# Patient Record
Sex: Female | Born: 1950
Health system: Southern US, Community
[De-identification: ages and names within clinical notes are randomized; demographics above are authoritative.]

## PROBLEM LIST (undated history)

## (undated) DIAGNOSIS — F0390 Unspecified dementia without behavioral disturbance: Secondary | ICD-10-CM

## (undated) DIAGNOSIS — R569 Unspecified convulsions: Secondary | ICD-10-CM

## (undated) HISTORY — DX: Unspecified dementia, unspecified severity, without behavioral disturbance, psychotic disturbance, mood disturbance, and anxiety: F03.90

---

## 1998-03-01 ENCOUNTER — Other Ambulatory Visit: Admission: RE | Admit: 1998-03-01 | Discharge: 1998-03-01 | Payer: Self-pay | Admitting: Obstetrics and Gynecology

## 1998-05-13 ENCOUNTER — Other Ambulatory Visit: Admission: RE | Admit: 1998-05-13 | Discharge: 1998-05-13 | Payer: Self-pay | Admitting: Obstetrics and Gynecology

## 1998-05-28 ENCOUNTER — Ambulatory Visit (HOSPITAL_COMMUNITY): Admission: RE | Admit: 1998-05-28 | Discharge: 1998-05-28 | Payer: Self-pay | Admitting: *Deleted

## 1999-05-11 ENCOUNTER — Other Ambulatory Visit: Admission: RE | Admit: 1999-05-11 | Discharge: 1999-05-11 | Payer: Self-pay | Admitting: Obstetrics and Gynecology

## 1999-08-09 ENCOUNTER — Other Ambulatory Visit: Admission: RE | Admit: 1999-08-09 | Discharge: 1999-08-09 | Payer: Self-pay | Admitting: Obstetrics and Gynecology

## 2000-01-10 ENCOUNTER — Encounter (INDEPENDENT_AMBULATORY_CARE_PROVIDER_SITE_OTHER): Payer: Self-pay | Admitting: Specialist

## 2000-01-10 ENCOUNTER — Encounter (INDEPENDENT_AMBULATORY_CARE_PROVIDER_SITE_OTHER): Payer: Self-pay

## 2000-01-10 ENCOUNTER — Ambulatory Visit (HOSPITAL_COMMUNITY): Admission: RE | Admit: 2000-01-10 | Discharge: 2000-01-10 | Payer: Self-pay | Admitting: Obstetrics and Gynecology

## 2001-09-27 ENCOUNTER — Other Ambulatory Visit: Admission: RE | Admit: 2001-09-27 | Discharge: 2001-09-27 | Payer: Self-pay | Admitting: Obstetrics and Gynecology

## 2002-10-07 ENCOUNTER — Other Ambulatory Visit: Admission: RE | Admit: 2002-10-07 | Discharge: 2002-10-07 | Payer: Self-pay | Admitting: Obstetrics and Gynecology

## 2003-10-20 ENCOUNTER — Other Ambulatory Visit: Admission: RE | Admit: 2003-10-20 | Discharge: 2003-10-20 | Payer: Self-pay | Admitting: Obstetrics and Gynecology

## 2004-10-21 ENCOUNTER — Other Ambulatory Visit: Admission: RE | Admit: 2004-10-21 | Discharge: 2004-10-21 | Payer: Self-pay | Admitting: Obstetrics and Gynecology

## 2007-07-31 ENCOUNTER — Ambulatory Visit (HOSPITAL_COMMUNITY): Admission: RE | Admit: 2007-07-31 | Discharge: 2007-07-31 | Payer: Self-pay | Admitting: *Deleted

## 2009-11-20 ENCOUNTER — Emergency Department (HOSPITAL_COMMUNITY): Admission: EM | Admit: 2009-11-20 | Discharge: 2009-11-20 | Payer: Self-pay | Admitting: Emergency Medicine

## 2010-05-29 ENCOUNTER — Encounter: Payer: Self-pay | Admitting: Obstetrics and Gynecology

## 2010-09-20 NOTE — Op Note (Signed)
NAMENIAMH, RADA              ACCOUNT NO.:  [id number]   MEDICAL RECORD NO.:  [id number]          PATIENT TYPE:  AMB   LOCATION:  ENDO                         FACILITY:  [facility]   PHYSICIAN:  Georgiana Spinner, M.D.    DATE OF BIRTH:  1950/07/24   DATE OF PROCEDURE:  07/31/2007  DATE OF DISCHARGE:                               OPERATIVE REPORT   PROCEDURE:  Colonoscopy.   ENDOSCOPIST:  Georgiana Spinner, M.D.   INDICATIONS:  Dilated colon seen on ultrasound   ANESTHESIA:  Fentanyl 100 mcg, Versed 10 mg.   PROCEDURE:  With the patient mildly sedated in the left lateral  decubitus position, the Pentax videoscopic pediatric colonoscope was  inserted into the rectum and passed under direct vision with pressure  applied to reach the cecum through a tortuous colon.  Cecum was  identified by ileocecal valve.  There was not well seen appendiceal  orifice, but clearly the base of the cecum was well seen.  Photographs  were taken.  From this point, the colonoscope was slowly withdrawn,  taking circumferential views of colonic mucosa, stopping only in the  rectum, which appeared normal on direct and showed small hemorrhoids on  retroflexed view.  The endoscope was straightened and withdrawn.  The  patient's vital signs and pulse oximetry remained stable.  The patient  tolerated the procedure well without apparent complications.   FINDINGS:  Tortuous colon which may have led to a gas-filled loop of  colon, but otherwise an unremarkable examination, other than small  internal hemorrhoids.   PLAN:  Consider repeat examination in 5-10 years.           ______________________________  Georgiana Spinner, M.D.     GMO/MEDQ  D:  07/31/2007  T:  07/31/2007  Job:  [id number]   cc:   Guy Sandifer. Henderson Cloud, M.D.  Fax: [phone]

## 2010-09-23 NOTE — Op Note (Signed)
[facility]  Patient:    Michele Porter, Michele Porter                     MRN: [id number] Proc. Date: 01/10/00 Adm. Date:  [id number] Attending:  Cordelia Pen Ii                           Operative Report  PREOPERATIVE DIAGNOSIS:       Right ovarian cyst.  POSTOPERATIVE DIAGNOSIS:      Right ovarian cyst.  OPERATION/PROCEDURE:          1. Laparoscopy.                               2. Right salpingo-oophorectomy.  SURGEON:                      Guy Sandifer. Arleta Creek, M.D.  ANESTHESIA:                   General, with endotracheal intubation.  ANESTHESIOLOGIST:             Octaviano Glow. Pamalee Leyden, M.D.  ESTIMATED BLOOD LOSS:         Drops.  INDICATIONS FOR PROCEDURE:    The patient is a 60 year old married white female, G0 P0, with a right ovarian cyst.  Details are dictated in the History and Physical.  Laparoscopy with probable right salpingo-oophorectomy was discussed.  The possible risks and complications have been discussed including, but not limited to, infection, bowel or bladder or ureteral damage, bleeding requiring transfusion of blood products with possible transfusion reaction, HIV, or hepatitis acquisition, DVT, PE, pneumonia, and laparotomy. All questions were answered and consent is signed and on the chart.  FINDINGS:                     Upper abdomen is normal.  The uterus is distorted with an approximate 3 cm subserosal right anterior fundal fibroid, as well as an approximate 2 cm subserosal posterior midline fundal fibroid. The left ovary and tube appear entirely normal.  The right ovary contains an approximate 2 cm cyst.  The right fallopian tube is normal.  Anterior and posterior cul-de-sac normal.  DESCRIPTION OF PROCEDURE:     The patient was taken to the operating room and placed in the dorsal supine position, and general anesthesia induced via endotracheal intubation.  She was then placed in the dorsal lithotomy position and prepped  abdominally and vaginally.  The bladder was straight catheterized. A Hulka tenaculum was placed on the uterus as a manipulator and she was draped in sterile fashion.  A small infraumbilical incision was made and a 12 mm disposable trocar sheath was placed on the first attempt without difficulty. Placement was verified with the laparoscope and no damage to surrounding structures was noted.  Pneumoperitoneum was induced and a small suprapubic and later a left lower quadrant incision made after careful transillumination, and 5 mm nondisposable trocar sleeves placed under direct visualization without difficulty.  The above findings were noted.  The course of the right ureter was carefully identified and found to be well clear of the right ovary.  Then using the 5 mm laparoscope through the left lower quadrant trocar sleeve the disposable stapler with the wide cartridge was used to take down the infundibulopelvic ligament and then the proximal ligaments in  two consecutive bites.  Switching back to the operative scope careful inspection revealed excellent hemostasis and good placement of the staples.  Then converting back again to the 5 mm laparoscope the Endo cage was used to remove the ovary through the umbilical incision without difficulty.  The umbilical trocar sleeve was then replaced bluntly without the trocar in place without difficulty, and switching back to the operative laparoscope careful inspection again revealed good excellent hemostasis.  The pelvis was washed and excess fluid was removed.  It should also be noted that pelvic washings are sent for cytology at the beginning of the case.  The inferior trocar sleeves were removed and the peritoneum was reduced, and no bleeding was noted.  The umbilical trocar sleeve was removed and the umbilical incision closed first with a 0 Vicryl suture in the deep underlying tissues, with care being taken not to pick up any underlying structures.   The skin incisions were all closed with subcuticular 3-0 Vicryl.  The incisions were injected with Marcaine 0.50% and dressings applied.  The Hulka tenaculum was removed and no bleeding was noted.  All counts were correct.  The patient was awakened and taken to the recovery room in stable condition. DD:  01/10/00 TD:  01/10/00 Job: [id number]

## 2011-07-14 ENCOUNTER — Other Ambulatory Visit (HOSPITAL_COMMUNITY): Payer: Self-pay | Admitting: Obstetrics and Gynecology

## 2011-07-14 DIAGNOSIS — R1032 Left lower quadrant pain: Secondary | ICD-10-CM

## 2011-07-19 ENCOUNTER — Ambulatory Visit (HOSPITAL_COMMUNITY)
Admission: RE | Admit: 2011-07-19 | Discharge: 2011-07-19 | Disposition: A | Discharge status: Home / Self Care | Payer: 59 | Source: Ambulatory Visit | Attending: Obstetrics and Gynecology | Admitting: Obstetrics and Gynecology

## 2011-07-19 DIAGNOSIS — D259 Leiomyoma of uterus, unspecified: Secondary | ICD-10-CM | POA: Insufficient documentation

## 2011-07-19 DIAGNOSIS — R1032 Left lower quadrant pain: Secondary | ICD-10-CM | POA: Insufficient documentation

## 2011-07-19 MED ORDER — IOHEXOL 300 MG/ML  SOLN
100.0000 mL | Freq: Once | INTRAMUSCULAR | Status: AC | PRN
  Administered 2011-07-19: 100 mL via INTRAVENOUS

## 2014-04-07 DIAGNOSIS — Z0000 Encounter for general adult medical examination without abnormal findings: Secondary | ICD-10-CM | POA: Diagnosis not present

## 2014-04-07 DIAGNOSIS — M810 Age-related osteoporosis without current pathological fracture: Secondary | ICD-10-CM | POA: Diagnosis not present

## 2014-04-14 DIAGNOSIS — I440 Atrioventricular block, first degree: Secondary | ICD-10-CM | POA: Diagnosis not present

## 2014-04-14 DIAGNOSIS — Z0000 Encounter for general adult medical examination without abnormal findings: Secondary | ICD-10-CM | POA: Diagnosis not present

## 2014-04-14 DIAGNOSIS — G43909 Migraine, unspecified, not intractable, without status migrainosus: Secondary | ICD-10-CM | POA: Diagnosis not present

## 2014-04-14 DIAGNOSIS — M810 Age-related osteoporosis without current pathological fracture: Secondary | ICD-10-CM | POA: Diagnosis not present

## 2014-05-15 DIAGNOSIS — J069 Acute upper respiratory infection, unspecified: Secondary | ICD-10-CM | POA: Diagnosis not present

## 2014-06-01 ENCOUNTER — Other Ambulatory Visit: Payer: Self-pay | Admitting: Obstetrics and Gynecology

## 2014-06-01 DIAGNOSIS — Z1231 Encounter for screening mammogram for malignant neoplasm of breast: Secondary | ICD-10-CM | POA: Diagnosis not present

## 2014-06-01 DIAGNOSIS — Z124 Encounter for screening for malignant neoplasm of cervix: Secondary | ICD-10-CM | POA: Diagnosis not present

## 2014-06-02 LAB — CYTOLOGY - PAP

## 2015-02-09 DIAGNOSIS — H1033 Unspecified acute conjunctivitis, bilateral: Secondary | ICD-10-CM | POA: Diagnosis not present

## 2015-02-16 DIAGNOSIS — H43811 Vitreous degeneration, right eye: Secondary | ICD-10-CM | POA: Diagnosis not present

## 2015-03-02 DIAGNOSIS — Z23 Encounter for immunization: Secondary | ICD-10-CM | POA: Diagnosis not present

## 2015-04-08 ENCOUNTER — Other Ambulatory Visit: Payer: Self-pay | Admitting: Internal Medicine

## 2015-04-08 DIAGNOSIS — R413 Other amnesia: Secondary | ICD-10-CM | POA: Diagnosis not present

## 2015-04-08 DIAGNOSIS — R4701 Aphasia: Secondary | ICD-10-CM | POA: Diagnosis not present

## 2015-04-08 DIAGNOSIS — G4489 Other headache syndrome: Secondary | ICD-10-CM

## 2015-04-08 DIAGNOSIS — R51 Headache: Secondary | ICD-10-CM | POA: Diagnosis not present

## 2015-04-09 ENCOUNTER — Other Ambulatory Visit: Payer: Self-pay | Admitting: Internal Medicine

## 2015-04-09 DIAGNOSIS — R51 Headache: Principal | ICD-10-CM

## 2015-04-09 DIAGNOSIS — R413 Other amnesia: Secondary | ICD-10-CM

## 2015-04-09 DIAGNOSIS — R519 Headache, unspecified: Secondary | ICD-10-CM

## 2015-04-09 DIAGNOSIS — R4701 Aphasia: Secondary | ICD-10-CM

## 2015-04-14 DIAGNOSIS — N390 Urinary tract infection, site not specified: Secondary | ICD-10-CM | POA: Diagnosis not present

## 2015-04-14 DIAGNOSIS — Z23 Encounter for immunization: Secondary | ICD-10-CM | POA: Diagnosis not present

## 2015-04-14 DIAGNOSIS — R3129 Other microscopic hematuria: Secondary | ICD-10-CM | POA: Diagnosis not present

## 2015-04-14 DIAGNOSIS — R319 Hematuria, unspecified: Secondary | ICD-10-CM | POA: Diagnosis not present

## 2015-04-14 DIAGNOSIS — M810 Age-related osteoporosis without current pathological fracture: Secondary | ICD-10-CM | POA: Diagnosis not present

## 2015-04-14 DIAGNOSIS — R413 Other amnesia: Secondary | ICD-10-CM | POA: Diagnosis not present

## 2015-04-14 DIAGNOSIS — L03114 Cellulitis of left upper limb: Secondary | ICD-10-CM | POA: Diagnosis not present

## 2015-04-14 DIAGNOSIS — Z0000 Encounter for general adult medical examination without abnormal findings: Secondary | ICD-10-CM | POA: Diagnosis not present

## 2015-04-14 DIAGNOSIS — I440 Atrioventricular block, first degree: Secondary | ICD-10-CM | POA: Diagnosis not present

## 2015-04-14 DIAGNOSIS — E559 Vitamin D deficiency, unspecified: Secondary | ICD-10-CM | POA: Diagnosis not present

## 2015-04-14 DIAGNOSIS — Z13220 Encounter for screening for lipoid disorders: Secondary | ICD-10-CM | POA: Diagnosis not present

## 2015-04-15 ENCOUNTER — Ambulatory Visit
Admission: RE | Admit: 2015-04-15 | Discharge: 2015-04-15 | Disposition: A | Discharge status: Home / Self Care | Payer: Medicare Other | Source: Ambulatory Visit | Attending: Internal Medicine | Admitting: Internal Medicine

## 2015-04-15 DIAGNOSIS — R51 Headache: Principal | ICD-10-CM

## 2015-04-15 DIAGNOSIS — R519 Headache, unspecified: Secondary | ICD-10-CM

## 2015-04-15 DIAGNOSIS — R4701 Aphasia: Secondary | ICD-10-CM | POA: Diagnosis not present

## 2015-04-15 DIAGNOSIS — R413 Other amnesia: Secondary | ICD-10-CM

## 2015-04-16 ENCOUNTER — Ambulatory Visit
Admission: RE | Admit: 2015-04-16 | Discharge: 2015-04-16 | Disposition: A | Discharge status: Home / Self Care | Payer: Medicare Other | Source: Ambulatory Visit | Attending: Internal Medicine | Admitting: Internal Medicine

## 2015-04-16 ENCOUNTER — Other Ambulatory Visit: Payer: Medicare Other

## 2015-04-16 DIAGNOSIS — G4489 Other headache syndrome: Secondary | ICD-10-CM

## 2015-04-16 DIAGNOSIS — R51 Headache: Secondary | ICD-10-CM | POA: Diagnosis not present

## 2015-04-16 MED ORDER — GADOBENATE DIMEGLUMINE 529 MG/ML IV SOLN
13.0000 mL | Freq: Once | INTRAVENOUS | Status: AC | PRN
  Administered 2015-04-16: 13 mL via INTRAVENOUS

## 2015-04-21 DIAGNOSIS — I440 Atrioventricular block, first degree: Secondary | ICD-10-CM | POA: Diagnosis not present

## 2015-04-21 DIAGNOSIS — R3129 Other microscopic hematuria: Secondary | ICD-10-CM | POA: Diagnosis not present

## 2015-04-21 DIAGNOSIS — R413 Other amnesia: Secondary | ICD-10-CM | POA: Diagnosis not present

## 2015-04-21 DIAGNOSIS — L03114 Cellulitis of left upper limb: Secondary | ICD-10-CM | POA: Diagnosis not present

## 2015-05-12 ENCOUNTER — Encounter: Payer: Self-pay | Admitting: Neurology

## 2015-05-12 ENCOUNTER — Ambulatory Visit (INDEPENDENT_AMBULATORY_CARE_PROVIDER_SITE_OTHER): Payer: Medicare Other | Admitting: Neurology

## 2015-05-12 VITALS — BP 122/64 | HR 72 | Ht 60.0 in | Wt 138.0 lb

## 2015-05-12 DIAGNOSIS — R519 Headache, unspecified: Secondary | ICD-10-CM

## 2015-05-12 DIAGNOSIS — R51 Headache: Secondary | ICD-10-CM

## 2015-05-12 DIAGNOSIS — R4189 Other symptoms and signs involving cognitive functions and awareness: Secondary | ICD-10-CM

## 2015-05-12 NOTE — Progress Notes (Signed)
NEUROLOGY CONSULTATION NOTE  FAE STETZ MRN: [id number] DOB: December 19, 1950  Referring provider: Dr. Shelia Media Primary care provider: Dr. Shelia Media  Reason for consult:  Cognitive impairment, headache  HISTORY OF PRESENT ILLNESS: Michele Porter is a 65 year old right-handed female with no significant past medical history who presents for headache, memory loss and speech difficulty.  History obtained by patient, her husband and PCP note.  Labs and imaging of brain MRI reviewed.  History primarily taken from husband, as patient is poor historian.  Her husband began noticing problems with speech and language about a year ago, which has progressed.  At first, she would mispronounce words or phrases.  She then had difficulty carrying on a conversation.  She has word-finding trouble as well as difficulty pronouncing words.  She is able to read without difficulty but if she should recite the words out loud, she has difficulty.  Comprehension seems intact.  She has had trouble with numbers as well.  She had trouble correctly punching the buttons on the microwave.  When she writes a check, she is able to write in the numbers but struggles spelling out the numbers.  She is able to perform everyday tasks, such as dressing and cooking.  She drives locally around town, such as to the grocery store without difficulty.  Behavior and personality are normal.  She denies depression although she is anxious about what is going on.  To her husband, both short-term memory and long-term memory seem overall intact.  Over the past 3 months, she has had reported headache.  It is more of an ache or sensation of "pings" along the maxillary sinuses.  It occurs once a week and responds to acetaminophen.  She has remote history of migraines, but these are different.  MRI of brain with and without contrast from 04/16/15 showed mild cerebral atrophy but no mass lesions or acute intracranial process.  RPR nonreactive, B12 1056, TSH  1.020, WBC and CMP unremarkable.  She graduated from [place] with a degree in Personnel officer.  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  Her mother was diagnosed with dementia late in life and passed away in her 74s.  Her maternal aunt and cousin also had dementia.  PAST MEDICAL HISTORY: No past medical history on file.  PAST SURGICAL HISTORY: No past surgical history on file.  MEDICATIONS: No current outpatient prescriptions on file prior to visit.   No current facility-administered medications on file prior to visit.    ALLERGIES: No Known Allergies  FAMILY HISTORY: No family history on file.  SOCIAL HISTORY: Social History   Social History  . Marital Status: Married    Spouse Name: N/A  . Number of Children: N/A  . Years of Education: N/A   Occupational History  . Not on file.   Social History Main Topics  . Smoking status: Never Smoker   . Smokeless tobacco: Not on file  . Alcohol Use: Not on file  . Drug Use: Not on file  . Sexual Activity: Not on file   Other Topics Concern  . Not on file   Social History Narrative   Pt has BS degree.     REVIEW OF SYSTEMS: Constitutional: No fevers, chills, or sweats, no generalized fatigue, change in appetite Eyes: No visual changes, double vision, eye pain Ear, nose and throat: No hearing loss, ear pain, nasal congestion, sore throat Cardiovascular: No chest pain, palpitations Respiratory:  No shortness of  breath at rest or with exertion, wheezes GastrointestinaI: No nausea, vomiting, diarrhea, abdominal pain, fecal incontinence Genitourinary:  No dysuria, urinary retention or frequency Musculoskeletal:  No neck pain, back pain Integumentary: No rash, pruritus, skin lesions Neurological: as above Psychiatric: No depression, insomnia, anxiety Endocrine: No palpitations, fatigue, diaphoresis, mood swings, change in appetite, change in weight, increased thirst Hematologic/Lymphatic:   No anemia, purpura, petechiae. Allergic/Immunologic: no itchy/runny eyes, nasal congestion, recent allergic reactions, rashes  PHYSICAL EXAM: Filed Vitals:   05/12/15 0838  BP: 122/64  Pulse: 72   General: No acute distress.  Patient appears well-groomed.  Head:  Normocephalic/atraumatic Eyes:  fundi unremarkable, without vessel changes, exudates, hemorrhages or papilledema. Neck: supple, no paraspinal tenderness, full range of motion Back: No paraspinal tenderness Heart: regular rate and rhythm Lungs: Clear to auscultation bilaterally. Vascular: No carotid bruits. Neurological Exam: Mental status: alert and oriented to person, place, month, day and date (but not year), delayed recall poor, remote memory intact, fund of knowledge intact, attention and concentration poor, visuospatial and executive dysfunction, speech fluent and not dysarthric.  She was able to name.  Naming fluency was poor.  She had trouble following some simple commands. Montreal Cognitive Assessment  05/12/2015  Visuospatial/ Executive (0/5) 0  Naming (0/3) 3  Attention: Read list of digits (0/2) 0  Attention: Read list of letters (0/1) 0  Attention: Serial 7 subtraction starting at 100 (0/3) 0  Language: Repeat phrase (0/2) 0  Language : Fluency (0/1) 0  Abstraction (0/2) 1  Delayed Recall (0/5) 0  Orientation (0/6) 4  Total 8  Adjusted Score (based on education) 8   Cranial nerves: CN I: not tested CN II: pupils equal, round and reactive to light, visual fields intact, fundi unremarkable, without vessel changes, exudates, hemorrhages or papilledema. CN III, IV, VI:  full range of motion, no nystagmus, no ptosis CN V: facial sensation intact CN VII: upper and lower face symmetric CN VIII: hearing intact CN IX, X: gag intact, uvula midline CN XI: sternocleidomastoid and trapezius muscles intact CN XII: tongue midline Bulk & Tone: normal, no fasciculations. Motor:  5/5 throughout  Sensation: temperature  and vibration sensation intact. Deep Tendon Reflexes:  2+ throughout, toes downgoing.  Finger to nose testing:  Without dysmetria.  Heel to shin:  Without dysmetria.  Gait:  Normal station and stride.  Able to turn and tandem walk. Romberg negative.  IMPRESSION: Patient demonstrates deficiencies in multiple domains but clinically she has primary deficits in language and perhaps some aspects of short-term memory.  Consider Alzheimer's or logopenic progressive aphasia. Headache.  Not true headache, but rather facial pain.  No evidence of sinusitis on MRI.   PLAN: Will refer for formal neuropsychological testing.  I will see her afterwards for further management. Headaches are overall manageable, so she should continue treating with acetaminophen (limited to no more than 2 days out of the week to prevent rebound headache) Advised not to drive at this time, given her visuospatial dysfunction and poor performance on Trail Making Test.  Thank you for allowing me to take part in the care of this patient.  Metta Clines, DO  CC:  Deland Pretty, MD

## 2015-05-12 NOTE — Progress Notes (Signed)
Chart forwarded.  

## 2015-05-12 NOTE — Patient Instructions (Signed)
We will refer you for neuropsychologic evaluation.  I want you to follow up afterwards. In meantime, please do not drive.

## 2015-05-19 ENCOUNTER — Telehealth: Payer: Self-pay | Admitting: Neurology

## 2015-05-19 DIAGNOSIS — R4189 Other symptoms and signs involving cognitive functions and awareness: Secondary | ICD-10-CM

## 2015-05-19 NOTE — Telephone Encounter (Signed)
PT's spouse Jeneen Rinks called and said they have not received a call from [facility] yet/Dawn CB# [phone]

## 2015-05-19 NOTE — Telephone Encounter (Signed)
Have confirmation referral was received via fax on 05/12/15. Refaxed just incase. Left message on pt's vm with [facility]'s contact info.

## 2015-05-21 ENCOUNTER — Telehealth: Payer: Self-pay | Admitting: Neurology

## 2015-05-21 NOTE — Telephone Encounter (Signed)
VM-PT's husband [name] left a message saying they still have not heard from [facility] yet and he said it is nine days later from when she was seen here by Dr Tomi Likens and  Still has not heard anything,  and is not happy about the lack of follow up with his wife/Dawn CB# [phone]

## 2015-05-21 NOTE — Telephone Encounter (Signed)
Spoke with husband. Upset that he has not heard from [facility] was left the first time husband called, but he stated he was reluctant to call because he didn't feel it was their responsibility. Encouraged pt to call if he was concerned about the wait.

## 2015-05-24 NOTE — Telephone Encounter (Signed)
Received fax from [facility] is 07/12/15 @11  am. Testing Session is 07/12/15 @ 1 p m. Feedback/Resutls scheduled for 07/28/15@ 2 pm.

## 2015-07-09 DIAGNOSIS — Z6822 Body mass index (BMI) 22.0-22.9, adult: Secondary | ICD-10-CM | POA: Diagnosis not present

## 2015-07-09 DIAGNOSIS — Z1231 Encounter for screening mammogram for malignant neoplasm of breast: Secondary | ICD-10-CM | POA: Diagnosis not present

## 2015-07-09 DIAGNOSIS — Z01419 Encounter for gynecological examination (general) (routine) without abnormal findings: Secondary | ICD-10-CM | POA: Diagnosis not present

## 2015-07-12 DIAGNOSIS — R41841 Cognitive communication deficit: Secondary | ICD-10-CM | POA: Diagnosis not present

## 2015-07-12 DIAGNOSIS — R450 Nervousness: Secondary | ICD-10-CM | POA: Diagnosis not present

## 2015-07-12 DIAGNOSIS — R413 Other amnesia: Secondary | ICD-10-CM | POA: Diagnosis not present

## 2015-07-28 DIAGNOSIS — G3101 Pick's disease: Secondary | ICD-10-CM | POA: Diagnosis not present

## 2015-07-28 DIAGNOSIS — R4702 Dysphasia: Secondary | ICD-10-CM | POA: Diagnosis not present

## 2015-07-28 DIAGNOSIS — F4322 Adjustment disorder with anxiety: Secondary | ICD-10-CM | POA: Diagnosis not present

## 2015-07-28 DIAGNOSIS — G309 Alzheimer's disease, unspecified: Secondary | ICD-10-CM | POA: Diagnosis not present

## 2015-07-29 ENCOUNTER — Telehealth: Payer: Self-pay

## 2015-07-29 NOTE — Telephone Encounter (Signed)
Husband called. Would like to know if you will prescribe the medication that were recommended by [facility] now, or not until seen? Next available appointment is 11/16/15. Is that soon enough? Please advise.

## 2015-07-30 NOTE — Telephone Encounter (Signed)
Pt scheduled for 08/09/15 @ 1 p.m.

## 2015-07-30 NOTE — Telephone Encounter (Signed)
I would like them to follow up next available so we can sit face to face to discuss the next steps.

## 2015-08-09 ENCOUNTER — Encounter: Payer: Self-pay | Admitting: Neurology

## 2015-08-09 ENCOUNTER — Ambulatory Visit (INDEPENDENT_AMBULATORY_CARE_PROVIDER_SITE_OTHER): Payer: Medicare Other | Admitting: Neurology

## 2015-08-09 VITALS — BP 130/64 | HR 66 | Ht 64.0 in | Wt 135.0 lb

## 2015-08-09 DIAGNOSIS — G3101 Pick's disease: Secondary | ICD-10-CM

## 2015-08-09 DIAGNOSIS — F0280 Dementia in other diseases classified elsewhere without behavioral disturbance: Secondary | ICD-10-CM

## 2015-08-09 MED ORDER — DONEPEZIL HCL 5 MG PO TABS: 5.0000 mg | ORAL_TABLET | Freq: Every day | ORAL | Status: DC

## 2015-08-09 NOTE — Progress Notes (Signed)
NEUROLOGY FOLLOW UP OFFICE NOTE  Michele Porter [id number]  HISTORY OF PRESENT ILLNESS: Michele Porter is a 65 year old right-handed female with no significant past medical history who follows up with her husband for logenic primary progressive aphasia to discuss neuropsychological testing results.  UPDATE: Michele Porter underwent neuropsychological testing on 07/12/15.  She demonstrated deficits of expressive language, repetition, confrontation naming, verbal fluency and comprehension, consistent with logopenic variant primary progressive aphasia.  She exhibited frequent phonemic paraphasias, circumlocution and slightly improved fluency of speech during casual conversation versus response to specific or complex questions.  She also met criteria for adjustment disorder with anxiety.  HISTORY: Her husband began noticing problems with speech and language about a year ago, which has progressed.  At first, she would mispronounce words or phrases.  She then had difficulty carrying on a conversation.  She has word-finding trouble as well as difficulty pronouncing words.  She is able to read without difficulty but if she should recite the words out loud, she has difficulty.  Comprehension seems intact.  She has had trouble with numbers as well.  She had trouble correctly punching the buttons on the microwave.  When she writes a check, she is able to write in the numbers but struggles spelling out the numbers.  She is able to perform everyday tasks, such as dressing and cooking.  She drives locally around town, such as to the grocery store without difficulty.  Behavior and personality are normal.  She denies depression although she is anxious about what is going on.  To her husband, both short-term memory and long-term memory seem overall intact.  Over the past 3 months, she has had reported headache.  It is more of an ache or sensation of "pings" along the maxillary sinuses.  It occurs once a week and  responds to acetaminophen.  She has remote history of migraines, but these are different.  MRI of brain with and without contrast from 04/16/15 showed mild cerebral atrophy but no mass lesions or acute intracranial process.  RPR nonreactive, B12 1056, TSH 1.020, WBC and CMP unremarkable.  She graduated from [place] with a degree in Personnel officer.  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  Her mother was diagnosed with dementia late in life and passed away in her 40s.  Her maternal aunt and cousin also had dementia.  PAST MEDICAL HISTORY: No past medical history on file.  MEDICATIONS: No current outpatient prescriptions on file prior to visit.   No current facility-administered medications on file prior to visit.    ALLERGIES: Allergies  Allergen Reactions  . Sulfa Antibiotics     FAMILY HISTORY: Family History  Problem Relation Age of Onset  . Dementia Mother     SOCIAL HISTORY: Social History   Social History  . Marital Status: Married    Spouse Name: N/A  . Number of Children: N/A  . Years of Education: N/A   Occupational History  . Not on file.   Social History Main Topics  . Smoking status: Never Smoker   . Smokeless tobacco: Not on file  . Alcohol Use: Not on file  . Drug Use: Not on file  . Sexual Activity: Not on file   Other Topics Concern  . Not on file   Social History Narrative   Pt has BS degree.     REVIEW OF SYSTEMS: Constitutional: No fevers, chills, or sweats, no generalized fatigue, change in appetite  Eyes: No visual changes, double vision, eye pain Ear, nose and throat: No hearing loss, ear pain, nasal congestion, sore throat Cardiovascular: No chest pain, palpitations Respiratory:  No shortness of breath at rest or with exertion, wheezes GastrointestinaI: No nausea, vomiting, diarrhea, abdominal pain, fecal incontinence Genitourinary:  No dysuria, urinary retention or frequency Musculoskeletal:  No  neck pain, back pain Integumentary: No rash, pruritus, skin lesions Neurological: as above Psychiatric: No depression, insomnia, anxiety Endocrine: No palpitations, fatigue, diaphoresis, mood swings, change in appetite, change in weight, increased thirst Hematologic/Lymphatic:  No anemia, purpura, petechiae. Allergic/Immunologic: no itchy/runny eyes, nasal congestion, recent allergic reactions, rashes  PHYSICAL EXAM: Filed Vitals:   08/09/15 1248  BP: 130/64  Pulse: 66   General: No acute distress.  Patient appears well-groomed.   Head:  Normocephalic/atraumatic  IMPRESSION: Logopenic variant progressive aphasia  PLAN: 1. Will initiate Aricept 2m at bedtime.  Will increase to 117mat bedtime in 4 weeks if tolerating.  After another 2 to 4 weeks, will initiate Namenda 2.  No driving until formal driving evaluation with OT. 3.  Continue routine exercise, hobbies, socialization 4.  Mediterranean diet 5.  Speech therapy 6.  Follow up in July  26 minutes spent face to face with patient, 100% spent discussing diagnosis and management.  Michele Porter  CC:  WaDeland Pretty

## 2015-08-09 NOTE — Patient Instructions (Signed)
1.  We will start donepezil (Aricept) 5mg  daily for four weeks.  If you are tolerating the medication, then after four weeks, we will increase the dose to 10mg  daily.  Side effects include nausea, vomiting, diarrhea, vivid dreams, and muscle cramps.  Please call the clinic if you experience any of these symptoms. 2.  At this point, I do not think you should drive until you have a formal driving evaluation.   3.  Continue routine exercise 4.  Recommend Mediterranean diet    Why follow it? Research shows. . Those who follow the Mediterranean diet have a reduced risk of heart disease  . The diet is associated with a reduced incidence of Parkinson's and Alzheimer's diseases . People following the diet may have longer life expectancies and lower rates of chronic diseases  . The Dietary Guidelines for Americans recommends the Mediterranean diet as an eating plan to promote health and prevent disease  What Is the Mediterranean Diet?  . Healthy eating plan based on typical foods and recipes of Mediterranean-style cooking . The diet is primarily a plant based diet; these foods should make up a majority of meals   Starches - Plant based foods should make up a majority of meals - They are an important sources of vitamins, minerals, energy, antioxidants, and fiber - Choose whole grains, foods high in fiber and minimally processed items  - Typical grain sources include wheat, oats, barley, corn, brown rice, bulgar, farro, millet, polenta, couscous  - Various types of beans include chickpeas, lentils, fava beans, black beans, white beans   Fruits  Veggies - Large quantities of antioxidant rich fruits & veggies; 6 or more servings  - Vegetables can be eaten raw or lightly drizzled with oil and cooked  - Vegetables common to the traditional Mediterranean Diet include: artichokes, arugula, beets, broccoli, brussel sprouts, cabbage, carrots, celery, collard greens, cucumbers, eggplant, kale, leeks, lemons,  lettuce, mushrooms, okra, onions, peas, peppers, potatoes, pumpkin, radishes, rutabaga, shallots, spinach, sweet potatoes, turnips, zucchini - Fruits common to the Mediterranean Diet include: apples, apricots, avocados, cherries, clementines, dates, figs, grapefruits, grapes, melons, nectarines, oranges, peaches, pears, pomegranates, strawberries, tangerines  Fats - Replace butter and margarine with healthy oils, such as olive oil, canola oil, and tahini  - Limit nuts to no more than a handful a day  - Nuts include walnuts, almonds, pecans, pistachios, pine nuts  - Limit or avoid candied, honey roasted or heavily salted nuts - Olives are central to the [employer] - can be eaten whole or used in a variety of dishes   Meats Protein - Limiting red meat: no more than a few times a month - When eating red meat: choose lean cuts and keep the portion to the size of deck of cards - Eggs: approx. 0 to 4 times a week  - Fish and lean poultry: at least 2 a week  - Healthy protein sources include, chicken, [place], lean beef, lamb - Increase intake of seafood such as tuna, salmon, trout, mackerel, shrimp, scallops - Avoid or limit high fat processed meats such as sausage and bacon  Dairy - Include moderate amounts of low fat dairy products  - Focus on healthy dairy such as fat free yogurt, skim milk, low or reduced fat cheese - Limit dairy products higher in fat such as whole or 2% milk, cheese, ice cream  Alcohol - Moderate amounts of red wine is ok  - No more than 5 oz daily for women (all ages)  and men older than age 67  - No more than 10 oz of wine daily for men younger than 61  Other - Limit sweets and other desserts  - Use herbs and spices instead of salt to flavor foods  - Herbs and spices common to the traditional Mediterranean Diet include: basil, bay leaves, chives, cloves, cumin, fennel, garlic, lavender, marjoram, mint, oregano, parsley, pepper, rosemary, sage, savory, sumac, tarragon,  thyme   It's not just a diet, it's a lifestyle:  . The Mediterranean diet includes lifestyle factors typical of those in the region  . Foods, drinks and meals are best eaten with others and savored . Daily physical activity is important for overall good health . This could be strenuous exercise like running and aerobics . This could also be more leisurely activities such as walking, housework, yard-work, or taking the stairs . Moderation is the key; a balanced and healthy diet accommodates most foods and drinks . Consider portion sizes and frequency of consumption of certain foods   Meal Ideas & Options:  . Breakfast:  o Whole wheat toast or whole wheat English muffins with peanut butter & hard boiled egg o Steel cut oats topped with apples & cinnamon and skim milk  o Fresh fruit: banana, strawberries, melon, berries, peaches  o Smoothies: strawberries, bananas, greek yogurt, peanut butter o Low fat greek yogurt with blueberries and granola  o Egg white omelet with spinach and mushrooms o Breakfast couscous: whole wheat couscous, apricots, skim milk, cranberries  . Sandwiches:  o Hummus and grilled vegetables (peppers, zucchini, squash) on whole wheat bread   o Grilled chicken on whole wheat pita with lettuce, tomatoes, cucumbers or tzatziki  o Tuna salad on whole wheat bread: tuna salad made with greek yogurt, olives, red peppers, capers, green onions o Garlic rosemary lamb pita: lamb sauted with garlic, rosemary, salt & pepper; add lettuce, cucumber, greek yogurt to pita - flavor with lemon juice and black pepper  . Seafood:  o Mediterranean grilled salmon, seasoned with garlic, basil, parsley, lemon juice and black pepper o Shrimp, lemon, and spinach whole-grain pasta salad made with low fat greek yogurt  o Seared scallops with lemon orzo  o Seared tuna steaks seasoned salt, pepper, coriander topped with tomato mixture of olives, tomatoes, olive oil, minced garlic, parsley, green  onions and cappers  . Meats:  o Herbed greek chicken salad with kalamata olives, cucumber, feta  o Red bell peppers stuffed with spinach, bulgur, lean ground beef (or lentils) & topped with feta   o Kebabs: skewers of chicken, tomatoes, onions, zucchini, squash  o [place] burgers: made with red onions, mint, dill, lemon juice, feta cheese topped with roasted red peppers . Vegetarian o Cucumber salad: cucumbers, artichoke hearts, celery, red onion, feta cheese, tossed in olive oil & lemon juice  o Hummus and whole grain pita points with a greek salad (lettuce, tomato, feta, olives, cucumbers, red onion) o Lentil soup with celery, carrots made with vegetable broth, garlic, salt and pepper  o Tabouli salad: parsley, bulgur, mint, scallions, cucumbers, tomato, radishes, lemon juice, olive oil, salt and pepper. 5.  Remain active (hobbies, socialize with friends and family) 64.  Follow up in July, as scheduled.

## 2015-08-09 NOTE — Progress Notes (Signed)
Chart forwarded.  

## 2015-08-23 ENCOUNTER — Telehealth: Payer: Self-pay | Admitting: Neurology

## 2015-08-23 DIAGNOSIS — R4701 Aphasia: Secondary | ICD-10-CM

## 2015-08-23 NOTE — Telephone Encounter (Signed)
Okay to referral to ST? Please advise.

## 2015-08-23 NOTE — Telephone Encounter (Signed)
Morgin Daigre ( husband) to Milaun Beams 2050-10-01. H e was calling this morning wanting to see about finding a speech therapist for his wife. She was last seen on 08/09/15. His contact number is [phone]. Thank  you

## 2015-08-23 NOTE — Telephone Encounter (Signed)
Yes

## 2015-08-23 NOTE — Telephone Encounter (Signed)
Referral placed to [facility]. [facility]) [phone]

## 2015-08-30 ENCOUNTER — Telehealth: Payer: Self-pay | Admitting: Neurology

## 2015-08-30 MED ORDER — DONEPEZIL HCL 10 MG PO TABS: 10.0000 mg | ORAL_TABLET | Freq: Every day | ORAL | Status: DC

## 2015-08-30 NOTE — Telephone Encounter (Signed)
New RX sent in. Husband aware.

## 2015-08-30 NOTE — Telephone Encounter (Signed)
Pt husband [name], states that we needs to call in [facility] in to the [facility] [address]. He states that we started her out on 5 mg and we need to more her to 10mg   Pt phone number is [phone]

## 2015-09-02 ENCOUNTER — Ambulatory Visit: Payer: Medicare Other | Attending: Neurology | Admitting: Speech Pathology

## 2015-09-02 DIAGNOSIS — R4701 Aphasia: Secondary | ICD-10-CM | POA: Diagnosis not present

## 2015-09-02 NOTE — Therapy (Signed)
[facility] [address] [place], [name], [address] Phone: [phone]   Fax:  [phone]  Speech Language Pathology Evaluation  Patient Details  Name: Michele Porter MRN: [id number] Date of Birth: 12/01/1950 Referring Provider: Dr. Metta Clines  Encounter Date: 09/02/2015      End of Session - 09/02/15 1702    Visit Number 1   Number of Visits 17   Date for SLP Re-Evaluation 11/23/15   Authorization Type Pt going on 2 week vacation middle of May - plan to start therapy upon their return, week of 10/04/15, therefore I put re-eval date out due to delayed start of therapy   SLP Start Time 1402   SLP Stop Time  1455   SLP Time Calculation (min) 53 min      No past medical history on file.  No past surgical history on file.  There were no vitals filed for this visit.      Subjective Assessment - 09/02/15 1410    Subjective "I know what I want to say, sometimes I can and sometimes I can't"   Patient is accompained by: Family member            SLP Evaluation [facility] - 09/02/15 1411    SLP Visit Information   SLP Received On 09/02/15   Referring Provider Dr. Metta Clines   Onset Date 05/2015   Medical Diagnosis Primary progressive aphasia   Subjective   Patient/Family Stated Goal "To help me communicate"   General Information   HPI Michele Porter is a 65 year old right-handed female with no significant past medical history with recent dx of logenic primary progressive aphasia. Michele Porter underwent neuropsychological testing on 07/12/15. She demonstrated deficits of expressive language, repetition, confrontation naming, verbal fluency and comprehension, consistent with logopenic variant primary progressive aphasia. She exhibited frequent phonemic paraphasias, circumlocution and slightly improved fluency of speech during casual conversation versus response to specific or complex questions. She also met criteria for  adjustment disorder with anxiety.   Mobility Status walks independently, no falls   Prior Functional Status   Cognitive/Linguistic Baseline Baseline deficits   Baseline deficit details progressive aphasia symptoms since last summer   Type of Home House    Lives With Spouse   Available Support Friend(s)   Vocation Retired   Associate Professor   Overall Cognitive Status --   Auditory Comprehension   Overall Auditory Comprehension Impaired   Yes/No Questions Impaired   Basic Immediate Environment Questions 75-100% accurate   Complex Questions 50-74% accurate   Commands Impaired   Two Step Basic Commands 75-100% accurate   Multistep Basic Commands 50-74% accurate   Complex Commands 50-74% accurate   Conversation Simple   EffectiveTechniques Extra processing time;Repetition;Slowed speech;Visual/Gestural cues   Reading Comprehension   Reading Status Impaired   Word level 76-100% accurate   Sentence Level 51-75% accurate   Paragraph Level Not tested   Effective Techniques Visual cueing   Expression   Primary Mode of Expression Verbal   Verbal Expression   Overall Verbal Expression Impaired   Initiation No impairment   Automatic Speech Name;Counting;Day of week;Month of year  Mod A for months    Level of Generative/Spontaneous Verbalization Conversation   Repetition Impaired   Level of Impairment Phrase level   Naming Impairment   Responsive 51-75% accurate   Confrontation 75-100% accurate   Convergent Not tested   Divergent 25-49% accurate   Verbal Errors Aware of errors   Pragmatics No  impairment   Written Expression   Dominant Hand Right   Written Expression Exceptions to [facility]   Dictation Ability Phrase   Self Formulation Ability Phrase   Oral Motor/Sensory Function   Overall Oral Motor/Sensory Function Appears within functional limits for tasks assessed   Motor Speech   Overall Motor Speech Appears within functional limits for tasks assessed                       ADULT SLP TREATMENT - 09/02/15 1730    Cognitive-Linquistic Treatment   Skilled Treatment Self care/home management: lengthy discussion with pt and spouse re: language activities to be done regularly at.. Initiated training in appropriate cueing for pt during homework provided and initiated training of compensations for aphasia with the pt.           SLP Education - 09/02/15 1701    Education provided Yes   Education Details areas of language impairment (reading, auditory, written, verbal expression), goals of therapy, compensations for aphasia   Person(s) Educated Patient;Spouse   Methods Explanation;Demonstration;Verbal cues   Comprehension Verbalized understanding;Returned demonstration;Need further instruction          SLP Short Term Goals - 09/02/15 1722    SLP SHORT TERM GOAL #1   Title Pt. will utilize multi-modal communication with 85% success during structured speech/language tasks with occasional min A   Time 4   Period Weeks   Status New   SLP SHORT TERM GOAL #2   Title Pt will complete simple naming activities with 85% accuracy and occasional min A   Time 4   Period Weeks   Status New   SLP SHORT TERM GOAL #3   Title Pt will comprehend functional information (signs, menus, directions) and 3-4 word sentences with 80% accuracy and occasional min A   Time 4   Period Weeks   Status New          SLP Long Term Goals - 09/02/15 1725    SLP LONG TERM GOAL #1   Title Pt will utilize multi modal communication (descriptives, word associations, gestures, writing,  drawing etc) to effectively participate in 8 minute simple conversation with occasional min A.   Time 8   Period Weeks   Status New   SLP LONG TERM GOAL #2   Title Spouse/caregiver will demonstrate adequate cueing to assist word finding and written expression during language tasks/homework  with rare min A   Time 8   Period Weeks   Status New   SLP LONG TERM GOAL #3   Title Pt will report carryover of  structure language activities at home over 4 sessions.   Time 8   Period Weeks   Status New          Plan - 09/02/15 1706    Clinical Impression Statement Michele Porter, a 65 y.o, female is referred for ST due to recent dx of primary progressive aphasia. Today she presents with moderate non fluent aphasia. Auditory comprehension is intact to simple 2 step commands, but 3 step commands, multi step directions and conversation benefit from reduced rate, use of high frequency words and repetition. Automatic speech is intact for counting and days of the week, but Mrs. Dumm required mod A for months of the year. Repetition is intact to 1-2 words, with more difficulty with longer phrases. Confrontation naming of basic objects  80% accurate with extended time. Responsive naming requried min to mod A, possibly due to reduced auditory comprehension.  Mrs. Schumm named 2 items for a simple category (10 in 1 min is [facility]). Simple conversation lacks cohesion due to vague generalizations during word finding episodes. Mrs. Headings' did attempt to compensate for dysnomia with 50% success. She is usually aware of errors. Reading comprehension is intact to 1-2 word level, with extended time. Written expression is intact to her name - she demonstrated aphasic errors writing her address and simple phrases to dictation. Mrs. Langworthy was aware of hre written errors, but required mod A to correct them. She exhibited anxiety during this evaluation, frequently wringing her hands. Despite moderate aphasia, Mrs. Rey is pleasant and quite talkative. She organizes her neighborhood's social actiivities and enjoys visiting her next door neighbor regularly. Her husband appears very supportive and willing to help Cyndi with therapy work. I recommend skilled ST to maximize effective communication and maintain language skills for independence.    Speech Therapy Frequency 2x / week   Duration --  8 weeks   Treatment/Interventions  Internal/external aids;Compensatory strategies;SLP instruction and feedback;Language facilitation;Cognitive reorganization;Patient/family education;Functional tasks;Multimodal communcation approach;Cueing hierarchy   Potential to Achieve Goals Fair   Potential Considerations Medical prognosis   Consulted and Agree with Plan of Care Patient   Family Member Consulted spouse [name]      Patient will benefit from skilled therapeutic intervention in order to improve the following deficits and impairments:   Aphasia - Plan: SLP plan of care cert/re-cert      G-Codes - 97/33/12 1729    Functional Assessment Tool Used NOMS   Functional Limitations Spoken language expressive   Spoken Language Expression Current Status [phone]) At least 40 percent but less than 60 percent impaired, limited or restricted   Spoken Language Expression Goal Status ([id number]) At least 40 percent but less than 60 percent impaired, limited or restricted   Spoken Language Expression Discharge Status [phone]) At least 40 percent but less than 60 percent impaired, limited or restricted      Problem List There are no active problems to display for this patient.   Lovvorn, [name] MS, CCC-SLP 09/02/2015, 5:33 PM  [facility] [address] [place], [name], [address] Phone: [phone]   Fax:  [phone]  Name: ELIANNAH HINDE MRN: [id number] Date of Birth: 17-Oct-1950

## 2015-10-05 ENCOUNTER — Telehealth: Payer: Self-pay | Admitting: Neurology

## 2015-10-05 ENCOUNTER — Ambulatory Visit: Payer: Medicare Other | Attending: Neurology | Admitting: Speech Pathology

## 2015-10-05 DIAGNOSIS — R4701 Aphasia: Secondary | ICD-10-CM | POA: Insufficient documentation

## 2015-10-05 MED ORDER — DONEPEZIL HCL 10 MG PO TABS: 10.0000 mg | ORAL_TABLET | Freq: Every day | ORAL | Status: DC

## 2015-10-05 NOTE — Telephone Encounter (Signed)
Rx sent to pharmacy   

## 2015-10-05 NOTE — Patient Instructions (Signed)
Tips for Talking with People who have Aphasia  . Say one thing at a time . Don't  rush - slow down, be patient . Reduce background noise . Relax - be natural . Use pen and paper . Write down key words . Draw diagrams or pictures . Don't pretend you understand . Ask what helps . Recap - check you both understand   Describing words  What group does it belong to?  What do I use it for?  Where can I find it?  What does it LOOK like?  What other words go with it?  What is the 1st sound of the word?  What color is it?  Many Ways to Communicate  Describe it Write it Draw it Gesture it Use related words  There's an App for that: Family Feud, Heads up, Stop-fun categories, What if, Noe Gens    Provided by: Barnabas Lister [place], [phone]

## 2015-10-05 NOTE — Therapy (Signed)
[facility] [address] [place], [name], [address] Phone: [phone]   Fax:  [phone]  Speech Language Pathology Treatment  Patient Details  Name: JESSKA VANVRANKEN MRN: [id number] Date of Birth: Oct 03, 1950 Referring Provider: Dr. Metta Clines  Encounter Date: 10/05/2015      End of Session - 10/05/15 1101    Visit Number 2   Number of Visits 17   Date for SLP Re-Evaluation 11/23/15   Authorization Type Pt going on 2 week vacation middle of May - plan to start therapy upon their return, week of 10/04/15, therefore I put re-eval date out due to delayed start of therapy   SLP Start Time 0845   SLP Stop Time  0932   SLP Time Calculation (min) 47 min   Activity Tolerance Patient tolerated treatment well      No past medical history on file.  No past surgical history on file.  There were no vitals filed for this visit.      Subjective Assessment - 10/05/15 0849    Subjective "We had a nice time - the weather was wonderful"   Patient is accompained by: Family member               ADULT SLP TREATMENT - 10/05/15 0850    General Information   Behavior/Cognition Alert;Cooperative   HPI Primary progressive aphasia   Treatment Provided   Treatment provided Cognitive-Linquistic   Pain Assessment   Pain Assessment No/denies pain   Cognitive-Linquistic Treatment   Treatment focused on Aphasia   Skilled Treatment Reviewed pt's homework - pt required cues to ID errors. Spouse present, reports he helped her with some of it. Facilitated compensations for aphasia with generating 4 desciptions of simple object/animal. Pt required usual mod A to make descriptions general and relevant. Picture stimuli was used and pt described specifics of picture instead of general triats (ie: he has a brown nose wit a white stripe vs this is an animal we ride with a mane). As descriptive activity progressed., pt improved to  occasional mod A/questioning cues. Pt named 3-4 items for a simple category with usual mod A and mod instructions to use compensations during this naming task. Provided word association homework to help train compensations for aphasia.   Assessment / Recommendations / Plan   Plan Continue with current plan of care   Progression Toward Goals   Progression toward goals Progressing toward goals          SLP Education - 10/05/15 1059    Education provided Yes   Education Details compensations for aphasia   Person(s) Educated Patient;Spouse   Methods Explanation;Demonstration;Handout;Verbal cues   Comprehension Verbalized understanding;Need further instruction;Verbal cues required          SLP Short Term Goals - 10/05/15 1101    SLP SHORT TERM GOAL #1   Title Pt. will utilize multi-modal communication with 85% success during structured speech/language tasks with occasional min A   Time 4   Period Weeks   Status On-going   SLP SHORT TERM GOAL #2   Title Pt will complete simple naming activities with 85% accuracy and occasional min A   Time 4   Period Weeks   Status On-going   SLP SHORT TERM GOAL #3   Title Pt will comprehend functional information (signs, menus, directions) and 3-4 word sentences with 80% accuracy and occasional min A   Time 4   Period Weeks   Status On-going  SLP Long Term Goals - 10/05/15 1101    SLP LONG TERM GOAL #1   Title Pt will utilize multi modal communication (descriptives, word associations, gestures, writing,  drawing etc) to effectively participate in 8 minute simple conversation with occasional min A.   Time 8   Period Weeks   Status On-going   SLP LONG TERM GOAL #2   Title Spouse/caregiver will demonstrate adequate cueing to assist word finding and written expression during language tasks/homework  with rare min A   Time 8   Period Weeks   Status On-going   SLP LONG TERM GOAL #3   Title Pt will report carryover of structure  language activities at home over 4 sessions.   Time 8   Period Weeks   Status On-going          Plan - 10/05/15 1059    Clinical Impression Statement Pt required mod A to use compensations for aphasia during structured speech tasks and mod A for simple naming. Continue skilled ST to maintain current language and train pt for compensations to maximize communication as disease progresses.    Speech Therapy Frequency 2x / week   Duration --  8 weeks   Treatment/Interventions Internal/external aids;Compensatory strategies;SLP instruction and feedback;Language facilitation;Cognitive reorganization;Patient/family education;Functional tasks;Multimodal communcation approach;Cueing hierarchy   Potential to Achieve Goals Fair   Potential Considerations Medical prognosis   Consulted and Agree with Plan of Care Patient;Family member/caregiver   Family Member Consulted spouse [name]      Patient will benefit from skilled therapeutic intervention in order to improve the following deficits and impairments:   Aphasia    Problem List There are no active problems to display for this patient.   Larico Dimock, [name] MS, CCC-SLP 10/05/2015, 11:02 AM  [facility] [address] [place], [name], [address] Phone: [phone]   Fax:  [phone]   Name: KALEIA REINDL MRN: [id number] Date of Birth: 08-22-1950

## 2015-10-05 NOTE — Telephone Encounter (Signed)
Michele Porter Sep 18, 2050. Her husband Jeneen Rinks called needing to get a refill on her Donapazil 10 MG. She uses [facility] on [address]  Her number is [phone]. Thank you

## 2015-10-07 ENCOUNTER — Ambulatory Visit: Payer: Medicare Other | Attending: Neurology | Admitting: Speech Pathology

## 2015-10-07 DIAGNOSIS — R4701 Aphasia: Secondary | ICD-10-CM | POA: Diagnosis not present

## 2015-10-07 NOTE — Therapy (Signed)
[facility] [address] [place], [name], [address] Phone: [phone]   Fax:  [phone]  Speech Language Pathology Treatment  Patient Details  Name: Michele Porter MRN: [id number] Date of Birth: 1950-08-30 Referring Provider: Dr. Metta Clines  Encounter Date: 10/07/2015      End of Session - 10/07/15 1437    Visit Number 3   Number of Visits 17   Date for SLP Re-Evaluation 11/23/15   Authorization Type Pt going on 2 week vacation middle of May - plan to start therapy upon their return, week of 10/04/15, therefore I put re-eval date out due to delayed start of therapy   SLP Start Time 1316   SLP Stop Time  1410   SLP Time Calculation (min) 54 min      No past medical history on file.  No past surgical history on file.  There were no vitals filed for this visit.      Subjective Assessment - 10/07/15 1429    Subjective "This homework helps with focus"               ADULT SLP TREATMENT - 10/07/15 1430    General Information   Behavior/Cognition Alert;Cooperative   HPI Primary progressive aphasia   Treatment Provided   Treatment provided Cognitive-Linquistic   Pain Assessment   Pain Assessment No/denies pain   Cognitive-Linquistic Treatment   Treatment focused on Aphasia   Skilled Treatment Pt and spouse instructed to visit their local fire station and let them know that pt has progressive aphasia in case she ever needs to contact emergency services or 911. I provided an  aphasia ID card  from [facility] website - instructed pt and spouse to make copies and place them in her car and purse. Also suggested they add Jimmy's (spouse) phone number on card.  Initiated training in simple gestures for basic objects to compensate for aphsasia as aphasia progresses. Pt required consistent max A for simple gestures due to difficulty comprehending the task. Gestures for items such as spoon, comb,  book, tissue etc required ST consistently modeling most salient gestures and pt imitating them. List of words to gesture sent home for practice. Synonym/antonym homework provided.    Assessment / Recommendations / Plan   Plan Continue with current plan of care   Progression Toward Goals   Progression toward goals Progressing toward goals          SLP Education - 10/07/15 1434    Education provided Yes   Education Details gestural communication including facial expressions as needed, compensations for aphasia   Person(s) Educated Patient;Spouse   Methods Explanation;Demonstration;Handout;Verbal cues   Comprehension Verbal cues required;Need further instruction          SLP Short Term Goals - 10/07/15 1437    SLP SHORT TERM GOAL #1   Title Pt. will utilize multi-modal communication with 85% success during structured speech/language tasks with occasional min A   Time 3   Period Weeks   Status On-going   SLP SHORT TERM GOAL #2   Title Pt will complete simple naming activities with 85% accuracy and occasional min A   Time 3   Period Weeks   Status On-going   SLP SHORT TERM GOAL #3   Title Pt will comprehend functional information (signs, menus, directions) and 3-4 word sentences with 80% accuracy and occasional min A   Time 3   Period Weeks   Status On-going  SLP Long Term Goals - 10/07/15 1437    SLP LONG TERM GOAL #1   Title Pt will utilize multi modal communication (descriptives, word associations, gestures, writing,  drawing etc) to effectively participate in 8 minute simple conversation with occasional min A.   Time 7   Period Weeks   Status On-going   SLP LONG TERM GOAL #2   Title Spouse/caregiver will demonstrate adequate cueing to assist word finding and written expression during language tasks/homework  with rare min A   Time 7   Period Weeks   Status On-going   SLP LONG TERM GOAL #3   Title Pt will report carryover of structure language activities at  home over 4 sessions.   Time 7   Period Weeks   Status On-going          Plan - 10/07/15 1435    Clinical Impression Statement Pt required max A for simple gesture training to compensate for aphasia as disease progresses. Instructions for 911 access and aphasia ID card provided. Continue skilled ST to maximize current language and carryover of compensations for aphasia now and as disease progresses   Speech Therapy Frequency 2x / week   Treatment/Interventions Internal/external aids;Compensatory strategies;SLP instruction and feedback;Language facilitation;Cognitive reorganization;Patient/family education;Functional tasks;Multimodal communcation approach;Cueing hierarchy   Potential to Achieve Goals Fair   Potential Considerations Medical prognosis   Consulted and Agree with Plan of Care Patient;Family member/caregiver   Family Member Consulted spouse [name]      Patient will benefit from skilled therapeutic intervention in order to improve the following deficits and impairments:   Aphasia    Problem List There are no active problems to display for this patient.   Priseis Cratty, [name]  MS, CCC-SLP  10/07/2015, 2:38 PM  [facility] [address] [place], [name], [address] Phone: [phone]   Fax:  [phone]   Name: Michele Porter MRN: [id number] Date of Birth: 1950/07/06

## 2015-10-12 ENCOUNTER — Ambulatory Visit: Payer: Medicare Other | Admitting: Speech Pathology

## 2015-10-12 DIAGNOSIS — R4701 Aphasia: Secondary | ICD-10-CM

## 2015-10-12 NOTE — Therapy (Signed)
[facility] [address] [place], [name], [address] Phone: [phone]   Fax:  [phone]  Speech Language Pathology Treatment  Patient Details  Name: KASSADIE OPSAHL MRN: [id number] Date of Birth: Dec 11, 1950 Referring Provider: Dr. Metta Clines  Encounter Date: 10/12/2015      End of Session - 10/12/15 1133    Visit Number 4   Number of Visits 17   Date for SLP Re-Evaluation 11/23/15   Authorization Type Pt going on 2 week vacation middle of May - plan to start therapy upon their return, week of 10/04/15, therefore I put re-eval date out due to delayed start of therapy   SLP Start Time 0844   SLP Stop Time  0931   SLP Time Calculation (min) 47 min      No past medical history on file.  No past surgical history on file.  There were no vitals filed for this visit.      Subjective Assessment - 10/12/15 0914    Currently in Pain? No/denies               ADULT SLP TREATMENT - 10/12/15 0927    General Information   Behavior/Cognition Alert;Cooperative   HPI Primary progressive aphasia   Treatment Provided   Treatment provided Cognitive-Linquistic   Pain Assessment   Pain Assessment No/denies pain   Cognitive-Linquistic Treatment   Treatment focused on Aphasia   Skilled Treatment Facilated compensations for aphasia with pt generating multiple meaning definitions of simple words with usual mod semantic cues and cloze phrases - pt with difficulty comprending how to complete cloze phrases. Introduced pt to [facility] for ongoing practice at home and upon d/c. Facilitated reading comprehension, simple naming and linguistic sequencing using [facility] with occasional mod cues for reading and sequencing.  Pt's spouse  state he is going to call [facility].    Assessment / Recommendations / Plan   Plan Continue with current plan of care   Progression Toward Goals   Progression toward goals  Progressing toward goals          SLP Education - 10/12/15 1130    Education provided Yes   Education Details use of apps ([facility]) for ongoing language enhacing activities.   Person(s) Educated Patient;Spouse   Methods Explanation;Demonstration;Tactile cues;Verbal cues   Comprehension Returned demonstration;Verbal cues required;Need further instruction          SLP Short Term Goals - 10/12/15 1133    SLP SHORT TERM GOAL #1   Title Pt. will utilize multi-modal communication with 85% success during structured speech/language tasks with occasional min A   Time 2   Period Weeks   Status On-going   SLP SHORT TERM GOAL #2   Title Pt will complete simple naming activities with 85% accuracy and occasional min A   Time 2   Period Weeks   Status On-going   SLP SHORT TERM GOAL #3   Title Pt will comprehend functional information (signs, menus, directions) and 3-4 word sentences with 80% accuracy and occasional min A   Time 2   Period Weeks   Status On-going          SLP Long Term Goals - 10/12/15 1133    SLP LONG TERM GOAL #1   Title Pt will utilize multi modal communication (descriptives, word associations, gestures, writing,  drawing etc) to effectively participate in 8 minute simple conversation with occasional min A.   Time 6   Period Weeks  Status On-going   SLP LONG TERM GOAL #2   Title Spouse/caregiver will demonstrate adequate cueing to assist word finding and written expression during language tasks/homework  with rare min A   Time 6   Period Weeks   Status On-going   SLP LONG TERM GOAL #3   Title Pt will report carryover of structure language activities at home over 4 sessions.   Time 6   Period Weeks   Status On-going          Plan - 10/12/15 1130    Clinical Impression Statement Reading comprehension and verbal sequening at sentence level required mod A. Compensations for aphasia in structured tasks and conversation with min to mod A. Continue  skilled ST to maintain current language skills, train caregiver on appropriate cueing, and maximize carryover of compensations for aphasia.    Speech Therapy Frequency 2x / week   Treatment/Interventions Internal/external aids;Compensatory strategies;SLP instruction and feedback;Language facilitation;Cognitive reorganization;Patient/family education;Functional tasks;Multimodal communcation approach;Cueing hierarchy   Potential to Achieve Goals Fair   Potential Considerations Medical prognosis   Consulted and Agree with Plan of Care Patient;Family member/caregiver   Family Member Consulted spouse [name]      Patient will benefit from skilled therapeutic intervention in order to improve the following deficits and impairments:   Aphasia    Problem List There are no active problems to display for this patient.   Eduin Friedel, [name] MS, CCC-SLP 10/12/2015, 11:34 AM  [facility] [address] [place], [name], [address] Phone: [phone]   Fax:  [phone]   Name: TAMESHIA VONASEK MRN: [id number] Date of Birth: September 03, 1950

## 2015-10-14 ENCOUNTER — Ambulatory Visit: Payer: Medicare Other

## 2015-10-14 DIAGNOSIS — R4701 Aphasia: Secondary | ICD-10-CM | POA: Diagnosis not present

## 2015-10-14 NOTE — Therapy (Signed)
[facility] [address] [place], [name], [address] Phone: [phone]   Fax:  [phone]  Speech Language Pathology Treatment  Patient Details  Name: Michele Porter MRN: [id number] Date of Birth: 04-11-1951 Referring Provider: Dr. Metta Clines  Encounter Date: 10/14/2015      End of Session - 10/14/15 1658    Visit Number 5   Number of Visits 17   Date for SLP Re-Evaluation 11/23/15   SLP Start Time [id number]   SLP Stop Time  1620   SLP Time Calculation (min) 46 min   Activity Tolerance Patient tolerated treatment well      No past medical history on file.  No past surgical history on file.  There were no vitals filed for this visit.      Subjective Assessment - 10/14/15 1541    Subjective Pt arrives with her husband.               ADULT SLP TREATMENT - 10/14/15 1655    General Information   Behavior/Cognition Alert;Cooperative;Requires cueing  appears anxious   Treatment Provided   Treatment provided Cognitive-Linquistic   Pain Assessment   Pain Assessment No/denies pain   Cognitive-Linquistic Treatment   Treatment focused on Aphasia   Skilled Treatment Pt completed naming tasks with rare min-mod A. Time pressure to name item noted to negatively impact pt performance. Verbal sentence completion (from written stimuli) req'd usual mod A.    Assessment / Recommendations / Plan   Plan Continue with current plan of care   Progression Toward Goals   Progression toward goals Progressing toward goals            SLP Short Term Goals - 10/14/15 1701    SLP SHORT TERM GOAL #1   Title Pt. will utilize multi-modal communication with 85% success during structured speech/language tasks with occasional min A   Time 2   Period Weeks   Status On-going   SLP SHORT TERM GOAL #2   Title Pt will complete simple naming activities with 85% accuracy and occasional min A   Time 2   Period Weeks   Status  On-going   SLP SHORT TERM GOAL #3   Title Pt will comprehend functional information (signs, menus, directions) and 3-4 word sentences with 80% accuracy and occasional min A   Time 2   Period Weeks   Status On-going          SLP Long Term Goals - 10/14/15 1701    SLP LONG TERM GOAL #1   Title Pt will utilize multi modal communication (descriptives, word associations, gestures, writing,  drawing etc) to effectively participate in 8 minute simple conversation with occasional min A.   Time 6   Period Weeks   Status On-going   SLP LONG TERM GOAL #2   Title Spouse/caregiver will demonstrate adequate cueing to assist word finding and written expression during language tasks/homework  with rare min A   Time 6   Period Weeks   Status On-going   SLP LONG TERM GOAL #3   Title Pt will report carryover of structure language activities at home over 4 sessions.   Time 6   Period Weeks   Status On-going          Plan - 10/14/15 1659    Clinical Impression Statement Pt's receptive and expressive language cont as impaired, and pt's simple conversation at this time is nonfunctional without SLP cues to reduce rate of speech. Pt  req'd mod-max cues for simple 3-step sequencing with tablet/app operation. Pt is strongly considering use of app-based therapy for completion when between ST sessions and following d/c. Continue skilled ST to maintain current language skills, train caregiver on appropriate cueing, and maximize carryover of compensations for aphasia.    Speech Therapy Frequency 2x / week   Duration --  6 weeks   Treatment/Interventions Internal/external aids;Compensatory strategies;SLP instruction and feedback;Language facilitation;Cognitive reorganization;Patient/family education;Functional tasks;Multimodal communcation approach;Cueing hierarchy   Potential to Achieve Goals Fair   Potential Considerations Medical prognosis;Severity of impairments      Patient will benefit from skilled  therapeutic intervention in order to improve the following deficits and impairments:   Aphasia    Problem List There are no active problems to display for this patient.   [facility] ,[place], [place]  10/14/2015, 5:03 PM  [facility] [address] [place], [name], [address] Phone: [phone]   Fax:  [phone]   Name: LENDER PASOS MRN: [id number] Date of Birth: April 25, 1951

## 2015-10-18 ENCOUNTER — Ambulatory Visit: Payer: Medicare Other | Admitting: Speech Pathology

## 2015-10-18 DIAGNOSIS — R4701 Aphasia: Secondary | ICD-10-CM | POA: Diagnosis not present

## 2015-10-18 NOTE — Therapy (Signed)
[facility] [address] [place], [name], [address] Phone: [phone]   Fax:  [phone]  Speech Language Pathology Treatment  Patient Details  Name: Michele Porter MRN: [id number] Date of Birth: 1951-04-06 Referring Provider: Dr. Metta Clines  Encounter Date: 10/18/2015      End of Session - 10/18/15 1029    Visit Number 6   Number of Visits 17   Date for SLP Re-Evaluation 11/23/15   SLP Start Time 0845   SLP Stop Time  0933   SLP Time Calculation (min) 48 min      No past medical history on file.  No past surgical history on file.  There were no vitals filed for this visit.      Subjective Assessment - 10/18/15 0851    Subjective "He was different" re: [name] instructing [facility]   Patient is accompained by: Family member               ADULT SLP TREATMENT - 10/18/15 0851    General Information   Behavior/Cognition Alert;Cooperative;Requires cueing   HPI Primary progressive aphasia   Treatment Provided   Treatment provided Cognitive-Linquistic   Pain Assessment   Pain Assessment No/denies pain   Cognitive-Linquistic Treatment   Treatment focused on Aphasia   Skilled Treatment Trained pt in compensations for eventual progression of aphasia using drawings of simple words for her spouse to guess with occasional min to mod cues for adding details to drawings - spouse guessed 80% of drawings. Facilitated linguistic fluency and sentence generation with multiple meaning sentences with usual mod semantic cues.    Assessment / Recommendations / Plan   Plan Continue with current plan of care   Progression Toward Goals   Progression toward goals Progressing toward goals          SLP Education - 10/18/15 1026    Education provided Yes   Education Details non-linguistic, non verbal compensations for aphasia   Person(s) Educated Patient;Spouse   Methods Explanation;Demonstration;Verbal cues    Comprehension Verbalized understanding;Returned demonstration;Verbal cues required;Need further instruction          SLP Short Term Goals - 10/18/15 1029    SLP SHORT TERM GOAL #1   Title Pt. will utilize multi-modal communication with 85% success during structured speech/language tasks with occasional min A   Time 1   Period Weeks   Status On-going   SLP SHORT TERM GOAL #2   Title Pt will complete simple naming activities with 85% accuracy and occasional min A   Time 1   Period Weeks   Status On-going   SLP SHORT TERM GOAL #3   Title Pt will comprehend functional information (signs, menus, directions) and 3-4 word sentences with 80% accuracy and occasional min A   Time 1   Period Weeks   Status On-going          SLP Long Term Goals - 10/18/15 1029    SLP LONG TERM GOAL #1   Title Pt will utilize multi modal communication (descriptives, word associations, gestures, writing,  drawing etc) to effectively participate in 8 minute simple conversation with occasional min A.   Time 5   Period Weeks   Status On-going   SLP LONG TERM GOAL #2   Title Spouse/caregiver will demonstrate adequate cueing to assist word finding and written expression during language tasks/homework  with rare min A   Time 5   Period Weeks   Status On-going   SLP LONG  TERM GOAL #3   Title Pt will report carryover of structure language activities at home over 4 sessions.   Time 5   Period Weeks   Status On-going          Plan - 10/18/15 1027    Clinical Impression Statement Pt required min to mod A for non linguistic aphasia compensations and verbal compensations for aphasia. Instructed pt to get dry erase board for simple drawing when needed. Continue skilled ST to maximize compensations for aphasia and verbal exression.l    Speech Therapy Frequency 2x / week   Duration --  5 weeks   Treatment/Interventions Internal/external aids;Compensatory strategies;SLP instruction and feedback;Language  facilitation;Cognitive reorganization;Patient/family education;Functional tasks;Multimodal communcation approach;Cueing hierarchy   Potential to Achieve Goals Fair   Potential Considerations Medical prognosis;Severity of impairments      Patient will benefit from skilled therapeutic intervention in order to improve the following deficits and impairments:   Aphasia    Problem List There are no active problems to display for this patient.   Lov[place], [name] MS, CCC-SLP 10/18/2015, 10:30 AM  [facility] [address] [place], [name], [address] Phone: [phone]   Fax:  [phone]   Name: Michele Porter MRN: [id number] Date of Birth: 1950/05/20

## 2015-10-21 ENCOUNTER — Ambulatory Visit: Payer: Medicare Other | Admitting: Speech Pathology

## 2015-10-21 DIAGNOSIS — R4701 Aphasia: Secondary | ICD-10-CM

## 2015-10-21 NOTE — Therapy (Signed)
[facility] [address] [place], [name], [address] Phone: [phone]   Fax:  [phone]  Speech Language Pathology Treatment  Patient Details  Name: Michele Porter MRN: [id number] Date of Birth: 1950/10/18 Referring Provider: Dr. Metta Clines  Encounter Date: 10/21/2015      End of Session - 10/21/15 1224    Visit Number 7   Number of Visits 17   Date for SLP Re-Evaluation 11/23/15   SLP Start Time 0847   SLP Stop Time  0931   SLP Time Calculation (min) 44 min   Activity Tolerance Patient tolerated treatment well      No past medical history on file.  No past surgical history on file.  There were no vitals filed for this visit.      Subjective Assessment - 10/21/15 0851    Subjective "Oh this was difficult -    Patient is accompained by: Family member   Currently in Pain? No/denies               ADULT SLP TREATMENT - 10/21/15 0854    General Information   Behavior/Cognition Alert;Cooperative;Requires cueing   HPI Primary progressive aphasia   Treatment Provided   Treatment provided Cognitive-Linquistic   Pain Assessment   Pain Assessment No/denies pain   Cognitive-Linquistic Treatment   Treatment focused on Aphasia   Skilled Treatment Reviewed homework - difficulty with more complex sentece Facilitated reading comprehension of functional materials flight schedules and weather reports with occasional min A, visual and verbal cues 80% accuracy. Simple naming categories with occasional semantic cues to name 4-5 items in a category.    Assessment / Recommendations / Plan   Plan Continue with current plan of care   Progression Toward Goals   Progression toward goals Progressing toward goals            SLP Short Term Goals - 10/21/15 1222    SLP SHORT TERM GOAL #1   Title Pt. will utilize multi-modal communication with 85% success during structured speech/language tasks with occasional  min A   Time 1   Period Weeks   Status Partially Met   SLP SHORT TERM GOAL #2   Title Pt will complete simple naming activities with 85% accuracy and occasional min A   Time 1   Period Weeks   Status Achieved   SLP SHORT TERM GOAL #3   Title Pt will comprehend functional information (signs, menus, directions) and 3-4 word sentences with 80% accuracy and occasional min A   Time 1   Period Weeks   Status Achieved          SLP Long Term Goals - 10/21/15 1224    SLP LONG TERM GOAL #1   Title Pt will utilize multi modal communication (descriptives, word associations, gestures, writing,  drawing etc) to effectively participate in 8 minute simple conversation with occasional min A.   Time 5   Period Weeks   Status On-going   SLP LONG TERM GOAL #2   Title Spouse/caregiver will demonstrate adequate cueing to assist word finding and written expression during language tasks/homework  with rare min A   Time 5   Period Weeks   Status On-going   SLP LONG TERM GOAL #3   Title Pt will report carryover of structure language activities at home over 4 sessions.   Baseline 10/21/15   Time 5   Period Weeks   Status On-going  Plan - 10/21/15 1221    Clinical Impression Statement Reading comprehension with occasional min A for simple functional reading. Occasional mod A for simple category naming. Continue skilled ST to maximize compensations. Pt and spouse report they practice drawing as compensation for progressive aphasia at home.   Speech Therapy Frequency 2x / week   Treatment/Interventions Internal/external aids;Compensatory strategies;SLP instruction and feedback;Language facilitation;Cognitive reorganization;Patient/family education;Functional tasks;Multimodal communcation approach;Cueing hierarchy   Potential to Achieve Goals Fair   Potential Considerations Medical prognosis;Severity of impairments   Consulted and Agree with Plan of Care Patient;Family member/caregiver    Family Member Consulted spouse [name]      Patient will benefit from skilled therapeutic intervention in order to improve the following deficits and impairments:   Aphasia    Problem List There are no active problems to display for this patient.   Michele Porter, Michele Rusk MS, CCC-SLP 10/21/2015, 12:25 PM  [facility] [address] [place], [name], [address] Phone: [phone]   Fax:  [phone]   Name: Michele Porter MRN: [id number] Date of Birth: 24-Apr-1951

## 2015-10-25 ENCOUNTER — Ambulatory Visit: Payer: Medicare Other | Admitting: Speech Pathology

## 2015-10-25 DIAGNOSIS — R4701 Aphasia: Secondary | ICD-10-CM

## 2015-10-25 NOTE — Patient Instructions (Signed)
  Cognitive Activities you can do at home:   - [place] (easy level)  - [place]   On your computer, tablet or phone: [email or web address] Geophysical data processor it out [employer] App Photo Quiz App Mix[employer] App What's the Word?App

## 2015-10-25 NOTE — Therapy (Signed)
[facility] [address] [place], [name], [address] Phone: [phone]   Fax:  [phone]  Speech Language Pathology Treatment  Patient Details  Name: Michele Porter MRN: [id number] Date of Birth: 10-Aug-1950 Referring Provider: Dr. Metta Clines  Encounter Date: 10/25/2015      End of Session - 10/25/15 1213    Visit Number 8   Number of Visits 17   Date for SLP Re-Evaluation 11/23/15   SLP Start Time 0846   SLP Stop Time  0933   SLP Time Calculation (min) 47 min      No past medical history on file.  No past surgical history on file.  There were no vitals filed for this visit.      Subjective Assessment - 10/25/15 0848    Subjective "She had to get up early today b/c I had an appointment"   Patient is accompained by: Family member   Currently in Pain? No/denies               ADULT SLP TREATMENT - 10/25/15 0852    General Information   Behavior/Cognition Alert;Cooperative;Requires cueing   HPI Primary progressive aphasia   Treatment Provided   Treatment provided Cognitive-Linquistic   Pain Assessment   Pain Assessment No/denies pain   Cognitive-Linquistic Treatment   Treatment focused on Aphasia   Skilled Treatment Facilitated naming tasks in categories to train compensations and spouse cueing  - utilizing usual written cues with blanks wtih 85% success. Trained in use of drawing as compensation for eventual progression of aphasia with usual mod A  for simple/basic objects.    Assessment / Recommendations / Plan   Plan Continue with current plan of care   Progression Toward Goals   Progression toward goals Progressing toward goals          SLP Education - 10/25/15 1209    Education provided Yes   Education Details non verbal compensations for aphasia, cogntive activities she can do at home   Person(s) Educated Patient;Spouse   Methods Explanation;Demonstration;Handout;Verbal cues   Comprehension Verbalized understanding;Returned demonstration;Verbal cues required;Need further instruction          SLP Short Term Goals - 10/25/15 Michele Porter #1   Title Pt. will utilize multi-modal communication with 85% success during structured speech/language tasks with occasional min A   Time 1   Period Weeks   Status Partially Met   SLP SHORT TERM GOAL #2   Title Pt will complete simple naming activities with 85% accuracy and occasional min A   Time 1   Period Weeks   Status Achieved   SLP SHORT TERM GOAL #3   Title Pt will comprehend functional information (signs, menus, directions) and 3-4 word sentences with 80% accuracy and occasional min A   Time 1   Period Weeks   Status Achieved          SLP Long Term Goals - 10/25/15 1213    SLP LONG TERM GOAL #1   Title Pt will utilize multi modal communication (descriptives, word associations, gestures, writing,  drawing etc) to effectively participate in 8 minute simple conversation with occasional min A.   Time 4   Period Weeks   Status On-going   SLP LONG TERM GOAL #2   Title Spouse/caregiver will demonstrate adequate cueing to assist word finding and written expression during language tasks/homework  with rare min A   Time 4   Period Weeks  Status On-going   SLP LONG TERM GOAL #3   Title Pt will report carryover of structure language activities at home over 4 sessions.   Baseline 10/21/15, 10/25/15   Time 4   Period Weeks   Status On-going          Plan - 10/25/15 1210    Clinical Impression Statement Pt required usual written and semantic cues today for naming tasks - spouse trained in cueing with written cues. Mod A for drawing simple objects ie: apple, butterfly. Cognitive impairment may be affecting pt's ability to analyze success of drawing. Provided spouse and pt  list of cognitive activites they can do at home. See pt instructions Continue skilled ST to maximize verbal expression and  carryover of compensations for progressive aphasia.    Speech Therapy Frequency 2x / week   Treatment/Interventions Internal/external aids;Compensatory strategies;SLP instruction and feedback;Language facilitation;Cognitive reorganization;Patient/family education;Functional tasks;Multimodal communcation approach;Cueing hierarchy   Potential to Achieve Goals Fair   Potential Considerations Medical prognosis;Severity of impairments   Consulted and Agree with Plan of Care Patient;Family member/caregiver   Family Member Consulted spouse [name]      Patient will benefit from skilled therapeutic intervention in order to improve the following deficits and impairments:   Aphasia    Problem List There are no active problems to display for this patient.   Lovvorn, [name] MS, CCC-SLP 10/25/2015, 12:14 PM  [facility] [address] [place], [name], [address] Phone: [phone]   Fax:  [phone]   Name: Michele Porter MRN: [id number] Date of Birth: [birth year]

## 2015-10-28 ENCOUNTER — Ambulatory Visit: Payer: Medicare Other | Admitting: Speech Pathology

## 2015-10-28 DIAGNOSIS — R4701 Aphasia: Secondary | ICD-10-CM | POA: Diagnosis not present

## 2015-10-28 NOTE — Therapy (Signed)
[facility] [address] [place], [name], [address] Phone: [phone]   Fax:  [phone]  Speech Language Pathology Treatment  Patient Details  Name: Michele Porter MRN: [id number] Date of Birth: 1950-11-10 Referring Provider: Dr. Metta Clines  Encounter Date: 10/28/2015      End of Session - 10/28/15 1126    Visit Number 9   Number of Visits 17   Date for SLP Re-Evaluation 11/23/15   SLP Start Time 0845   SLP Stop Time  0931   SLP Time Calculation (min) 46 min      No past medical history on file.  No past surgical history on file.  There were no vitals filed for this visit.      Subjective Assessment - 10/28/15 0851    Subjective "I have been planning my neighborhood party and organizing who brings food"   Patient is accompained by: Family member               ADULT SLP TREATMENT - 10/28/15 0858    General Information   Behavior/Cognition Alert;Cooperative;Requires cueing   HPI Primary progressive aphasia   Treatment Provided   Treatment provided Cognitive-Linquistic   Pain Assessment   Pain Assessment No/denies pain   Cognitive-Linquistic Treatment   Treatment focused on Aphasia   Skilled Treatment Pt and spouse report "speech therapy is really helping me." Pt has informed family and friends via e mail about her PPA this past week. Simple conversation re: her neighborhood party with 90% success using compensations for anomia with rare min A - including gestures and descriptives. Trained pt and spouse for compensations for aphasia when in large group conversations, noisy environments, phone conversations and instructed  spouse in written cues during situations that requried more linguistic load.  Spouse verbalized "this is the most she has talked in a while."   Assessment / Recommendations / [place] with current plan of care   Progression Toward Goals   Progression toward goals  Progressing toward goals          SLP Education - 10/28/15 1123    Education provided Yes   Education Details compensations for aphasia in situations with increased linguistic load - see pt instructions   Person(s) Educated Patient;Spouse   Methods Explanation;Demonstration;Verbal cues;Handout   Comprehension Verbalized understanding;Returned demonstration;Need further instruction          SLP Short Term Goals - 10/28/15 1125    SLP SHORT TERM GOAL #1   Title Pt. will utilize multi-modal communication with 85% success during structured speech/language tasks with occasional min A   Time 1   Period Weeks   Status Achieved   SLP SHORT TERM GOAL #2   Title Pt will complete simple naming activities with 85% accuracy and occasional min A   Time 1   Period Weeks   Status Achieved   SLP SHORT TERM GOAL #3   Title Pt will comprehend functional information (signs, menus, directions) and 3-4 word sentences with 80% accuracy and occasional min A   Time 1   Period Weeks   Status Achieved          SLP Long Term Goals - 10/28/15 1126    SLP LONG TERM GOAL #1   Title Pt will utilize multi modal communication (descriptives, word associations, gestures, writing,  drawing etc) to effectively participate in 8 minute simple conversation with occasional min A.   Time 4   Period Weeks  Status On-going   SLP LONG TERM GOAL #2   Title Spouse/caregiver will demonstrate adequate cueing to assist word finding and written expression during language tasks/homework  with rare min A   Time 4   Period Weeks   Status On-going   SLP LONG TERM GOAL #3   Title Pt will report carryover of structure language activities at home over 4 sessions.   Baseline 10/21/15, 10/25/15, 10/28/15   Time 4   Period Weeks   Status On-going          Plan - 10/28/15 1124    Clinical Impression Statement Simple conversation over 15 minutes with rare min requests for repair (2) and use of mulitmodal compensations  for aphasia with rare min A. Continue skilled ST to maximiz carryover of compensations for aphasia and maximize verbal expression for independence.    Speech Therapy Frequency 2x / week   Duration 4 weeks   Treatment/Interventions Internal/external aids;Compensatory strategies;SLP instruction and feedback;Language facilitation;Cognitive reorganization;Patient/family education;Functional tasks;Multimodal communcation approach;Cueing hierarchy   Potential to Achieve Goals Good   Consulted and Agree with Plan of Care Patient;Family member/caregiver   Family Member Consulted spouse Michele Porter      Patient will benefit from skilled therapeutic intervention in order to improve the following deficits and impairments:   Aphasia    Problem List There are no active problems to display for this patient.   Michele Porter, [name] MS, CCC-SLP 10/28/2015, 11:27 AM  [facility] [address] [place], [name], [address] Phone: [phone]   Fax:  [phone]   Name: Michele Porter MRN: [id number] Date of Birth: [birth year]

## 2015-10-28 NOTE — Patient Instructions (Signed)
   Distinguish aphasia from memory problems   Phone can be more difficult because it takes away non linguistic cues of gestures, facial expressions, body language  93% of communication is non verbal   It's OK to use a written cue - write the name, write a topic. Keep a pad and pen with you  Conversation in a large group can be difficult - feel free to ask someone to repeat - turn music down  Michele Porter has a private room, so does [employer] people not to fill in your words for you - they may get your message wrong!  Multi tasking, learning new skill, games may tax your language - that's OK!! It's good to multi task and try new things!

## 2015-11-01 ENCOUNTER — Ambulatory Visit: Payer: Medicare Other | Admitting: Speech Pathology

## 2015-11-01 DIAGNOSIS — R4701 Aphasia: Secondary | ICD-10-CM

## 2015-11-01 NOTE — Therapy (Signed)
[facility] [address] [place], [name], [address] Phone: [phone]   Fax:  [phone]  Speech Language Pathology Treatment  Patient Details  Name: Michele Porter MRN: [id number] Date of Birth: 09/13/1950 Referring Provider: Dr. Metta Clines  Encounter Date: 11/01/2015      End of Session - 11/01/15 0934    Visit Number 10   Number of Visits 17   Date for SLP Re-Evaluation 11/23/15   SLP Start Time 0847   SLP Stop Time  0932   SLP Time Calculation (min) 45 min   Activity Tolerance Patient tolerated treatment well      No past medical history on file.  No past surgical history on file.  There were no vitals filed for this visit.      Subjective Assessment - 11/01/15 0848    Subjective "The party was fun and lots of good food"   Patient is accompained by: Family member               ADULT SLP TREATMENT - 11/01/15 0849    General Information   Behavior/Cognition Alert;Cooperative;Requires cueing   HPI Primary progressive aphasia   Treatment Provided   Treatment provided Cognitive-Linquistic   Pain Assessment   Pain Assessment No/denies pain   Cognitive-Linquistic Treatment   Treatment focused on Aphasia   Skilled Treatment Simple conversation re: party she planned and recipe she made with successful use of gestures and descriptions. Mildly complex  divergent naming task  with  occasion min to mod questioning and semantic cues.Spouse reports use of  written cues to help pt remember     Assessment / Recommendations / [place] with current plan of care   Progression Toward Goals   Progression toward goals Progressing toward goals          SLP Education - 11/01/15 0931    Education provided Yes   Education Details use of written cues by spouse to elicit word finding and cognitive (recall)    Person(s) Educated Patient;Spouse   Methods Explanation;Demonstration;Verbal cues   Comprehension Verbalized understanding;Returned demonstration          SLP Short Term Goals - 11/01/15 0933    SLP SHORT TERM GOAL #1   Title Pt. will utilize multi-modal communication with 85% success during structured speech/language tasks with occasional min A   Time 1   Period Weeks   Status Achieved   SLP SHORT TERM GOAL #2   Title Pt will complete simple naming activities with 85% accuracy and occasional min A   Time 1   Period Weeks   Status Achieved   SLP SHORT TERM GOAL #3   Title Pt will comprehend functional information (signs, menus, directions) and 3-4 word sentences with 80% accuracy and occasional min A   Time 1   Period Weeks   Status Achieved          SLP Long Term Goals - 11/01/15 0933    SLP LONG TERM GOAL #1   Title Pt will utilize multi modal communication (descriptives, word associations, gestures, writing,  drawing etc) to effectively participate in 8 minute simple conversation with occasional min A.   Time 3   Period Weeks   Status On-going   SLP LONG TERM GOAL #2   Title Spouse/caregiver will demonstrate adequate cueing to assist word finding and written expression during language tasks/homework  with rare min A   Time 3   Period Weeks  Status On-going   SLP LONG TERM GOAL #3   Title Pt will report carryover of structure language activities at home over 4 sessions.   Baseline 10/21/15, 10/25/15, 10/28/15   Time 3   Period Weeks   Status On-going          Plan - 12/01/2015 0932    Clinical Impression Statement Pt continues to utilize gestures and descriptives to compensate for aphasia with rare min A and rare min requests for clarification. Continue skilled ST to maximize carryover of compensations for aphasia and maximize verbal expression   Speech Therapy Frequency 2x / week   Duration --  3 weeks   Treatment/Interventions Internal/external aids;Compensatory strategies;SLP instruction and feedback;Language facilitation;Cognitive  reorganization;Patient/family education;Functional tasks;Multimodal communcation approach;Cueing hierarchy   Potential to Achieve Goals Good   Potential Considerations Medical prognosis;Severity of impairments   Consulted and Agree with Plan of Care Patient;Family member/caregiver   Family Member Consulted spouse [name]      Patient will benefit from skilled therapeutic intervention in order to improve the following deficits and impairments:   Aphasia      G-Codes - 2015/12/01 09-05-05    Functional Assessment Tool Used NOMS   Functional Limitations Spoken language expressive   Spoken Language Expression Current Status [phone]) At least 20 percent but less than 40 percent impaired, limited or restricted   Spoken Language Expression Goal Status [id number]) At least 20 percent but less than 40 percent impaired, limited or restricted     Speech Therapy Progress Note  Dates of Reporting Period: 09/02/15  to 12/01/15  Objective Reports of Subjective Statement: Spouse and pt both report improvement in conversational language/speech.   Objective Measurements: Pt utilizing multimodal communication of gestures to augment verbal compensations for aphasia with rare min A over 12 minute conversations. She continues to required mod A for drawing to augment verbal expression. Spouse appropriately cueing pt with occasional min A for questioning cues; mod A for written and phonemic cues.  Spouse and pt have consistently done ST provided activities at home, they are in the process of subscribing to [employer] for ongoing cognitive linguistic activities upon d/c of ST  Goal Update: Continue LTG's above  Plan: Continue skilled ST 2x a week for 3 more weeks per POC above  Reason Skilled Services are Required: Continue skilled ST to maximize carryover of linguistic and non linguistic compensations for aphasia. To continue training in non-linguistic compensations for aphasia to help pt and spouse prepare for  progression of aphasia. To continue to train spouse in strategies to support, cue and facilitate pt's communication at her current level of function and as the aphasia progresses.  Problem List There are no active problems to display for this patient.   Shauniece Kwan, [name] MS, CCC-SLP 2015/12/01, 11:07 AM  [facility] [address] [place], [name], [address] Phone: [phone]   Fax:  [phone]   Name: Michele Porter MRN: [id number] Date of Birth: 05-Feb-1951

## 2015-11-01 NOTE — Patient Instructions (Signed)
  Try your homework without [employer] or computer. If you can't fill in every blank, that is OK. Try again another day and see if you can think of more. If not, that is OK.

## 2015-11-04 ENCOUNTER — Ambulatory Visit: Payer: Medicare Other | Admitting: Speech Pathology

## 2015-11-04 DIAGNOSIS — R4701 Aphasia: Secondary | ICD-10-CM

## 2015-11-04 NOTE — Therapy (Signed)
[facility] [address] [place], [name], [address] Phone: [phone]   Fax:  [phone]  Speech Language Pathology Treatment  Patient Details  Name: Michele Porter MRN: [id number] Date of Birth: [birth year] Referring Provider: Dr. Metta Clines  Encounter Date: 11/04/2015      End of Session - 11/04/15 1444    Visit Number 11   Number of Visits 17   Date for SLP Re-Evaluation 11/23/15   SLP Start Time [id number]   SLP Stop Time  1447   SLP Time Calculation (min) 44 min   Activity Tolerance Patient tolerated treatment well      No past medical history on file.  No past surgical history on file.  There were no vitals filed for this visit.      Subjective Assessment - 11/04/15 1419    Subjective "I did the homework over several"   Patient is accompained by: Family member   Currently in Pain? No/denies               ADULT SLP TREATMENT - 11/04/15 1420    General Information   Behavior/Cognition Alert;Cooperative;Requires cueing   HPI Primary progressive aphasia   Treatment Provided   Treatment provided Cognitive-Linquistic   Pain Assessment   Pain Assessment No/denies pain   Cognitive-Linquistic Treatment   Treatment focused on Aphasia   Skilled Treatment Reviewed moderately complex reading comprehension with usual mod to max A to ID errors in details - Simple conversation re: errands with rare min questioning cues to facilitate descriptives and gestures.    Assessment / Recommendations / Plan   Plan Continue with current plan of care   Progression Toward Goals   Progression toward goals Progressing toward goals            SLP Short Term Goals - 11/04/15 1450    SLP SHORT TERM GOAL #1   Title Pt. will utilize multi-modal communication with 85% success during structured speech/language tasks with occasional min A   Time 1   Period Weeks   Status Achieved   SLP SHORT TERM GOAL #2   Title  Pt will complete simple naming activities with 85% accuracy and occasional min A   Time 1   Period Weeks   Status Achieved   SLP SHORT TERM GOAL #3   Title Pt will comprehend functional information (signs, menus, directions) and 3-4 word sentences with 80% accuracy and occasional min A   Time 1   Period Weeks   Status Achieved          SLP Long Term Goals - 11/04/15 1450    SLP LONG TERM GOAL #1   Title Pt will utilize multi modal communication (descriptives, word associations, gestures, writing,  drawing etc) to effectively participate in 8 minute simple conversation with occasional min A.   Time 3   Period Weeks   Status On-going   SLP LONG TERM GOAL #2   Title Spouse/caregiver will demonstrate adequate cueing to assist word finding and written expression during language tasks/homework  with rare min A   Time 3   Period Weeks   Status On-going   SLP LONG TERM GOAL #3   Title Pt will report carryover of structure language activities at home over 4 sessions.   Baseline 10/21/15, 10/25/15, 10/28/15   Time 3   Period Weeks   Status On-going          Plan - 11/04/15 1449    Clinical Impression Statement  Pt continues to utilize gestures and descriptives to compensate for aphasia with rare min A and rare min requests for clarification. Moderately complex reading comprehension with usual mod A to ID errors.  Continue skilled ST to maximize carryover of compensations for aphasia and maximize verbal expression   Speech Therapy Frequency 2x / week   Duration --  3 weeks   Treatment/Interventions Internal/external aids;Compensatory strategies;SLP instruction and feedback;Language facilitation;Cognitive reorganization;Patient/family education;Functional tasks;Multimodal communcation approach;Cueing hierarchy   Potential to Achieve Goals Good   Potential Considerations Medical prognosis;Severity of impairments   Consulted and Agree with Plan of Care Patient;Family member/caregiver    Family Member Consulted spouse [name]      Patient will benefit from skilled therapeutic intervention in order to improve the following deficits and impairments:   Aphasia    Problem List There are no active problems to display for this patient.   Lovvorn, [name] MS, [place] 11/04/2015, 2:50 PM  [facility] [address] [place], [name], [address] Phone: [phone]   Fax:  [phone]   Name: Michele Porter MRN: [id number] Date of Birth: 05-06-1951

## 2015-11-08 ENCOUNTER — Ambulatory Visit: Payer: Medicare Other | Attending: Neurology | Admitting: Speech Pathology

## 2015-11-08 DIAGNOSIS — R4701 Aphasia: Secondary | ICD-10-CM | POA: Insufficient documentation

## 2015-11-08 NOTE — Therapy (Signed)
[facility] [address] [place], [name], [address] Phone: [phone]   Fax:  [phone]  Speech Language Pathology Treatment  Patient Details  Name: Michele Porter MRN: [id number] Date of Birth: Aug 23, 1950 Referring Provider: Dr. Metta Clines  Encounter Date: 11/08/2015      End of Session - 11/08/15 1220    Visit Number 12   Number of Visits 17   Date for SLP Re-Evaluation 11/23/15   SLP Start Time 0846   SLP Stop Time  0931   SLP Time Calculation (min) 45 min      No past medical history on file.  No past surgical history on file.  There were no vitals filed for this visit.      Subjective Assessment - 11/08/15 0850    Subjective "We see Dr. Tomi Likens on teh 19th - he may add a med then"   Patient is accompained by: Family member   Currently in Pain? No/denies               ADULT SLP TREATMENT - 11/08/15 0851    General Information   Behavior/Cognition Alert;Cooperative;Requires cueing   HPI Primary progressive aphasia   Treatment Provided   Treatment provided Cognitive-Linquistic   Pain Assessment   Pain Assessment No/denies pain   Cognitive-Linquistic Treatment   Treatment focused on Aphasia   Skilled Treatment Reviewed homework of writing check amounts using words for numbers with rare min A from spouse. Pt reports some difficulty with check writing. Naming facilitated via simple analogies with usual repetition (auditory comprehension/processing) and semantic cues embedded in the analogy with 80% accuracy. Verbal expression facilitated naming similarities and differences of 2 related items with usual min to mod semantic cues. Spouse provided semantic cues as needed. Pt stated she prefers to do these exercises on paper. I re-educated her and her sposue re: benefits of performing language activities with auditory/verbal tasks as well as reading/writing to maximize language and verbal expression   Assessment / Recommendations / [place] with current plan of care   Progression Toward Goals   Progression toward goals Progressing toward goals            SLP Short Term Goals - 11/08/15 1219    SLP SHORT TERM GOAL #1   Title Pt. will utilize multi-modal communication with 85% success during structured speech/language tasks with occasional min A   Time 1   Period Weeks   Status Achieved   SLP SHORT TERM GOAL #2   Title Pt will complete simple naming activities with 85% accuracy and occasional min A   Time 1   Period Weeks   Status Achieved   SLP SHORT TERM GOAL #3   Title Pt will comprehend functional information (signs, menus, directions) and 3-4 word sentences with 80% accuracy and occasional min A   Time 1   Period Weeks   Status Achieved          SLP Long Term Goals - 11/08/15 1220    SLP LONG TERM GOAL #1   Title Pt will utilize multi modal communication (descriptives, word associations, gestures, writing,  drawing etc) to effectively participate in 8 minute simple conversation with occasional min A.   Time 2   Period Weeks   Status On-going   SLP LONG TERM GOAL #2   Title Spouse/caregiver will demonstrate adequate cueing to assist word finding and written expression during language tasks/homework  with rare min A   Time  2   Period Weeks   Status On-going   SLP LONG TERM GOAL #3   Title Pt will report carryover of structure language activities at home over 4 sessions.   Baseline 10/21/15, 10/25/15, 10/28/15; 11/08/15   Time 2   Period Weeks   Status Achieved          Plan - 11/08/15 1218    Clinical Impression Statement Mildly complex naming tasks today with 80% accuracy and some frustration. Continue skilled ST to maximizare carryover of compensations for progressive aphasia and verbal expression   Speech Therapy Frequency 2x / week   Duration 2 weeks   Treatment/Interventions Internal/external aids;Compensatory strategies;SLP instruction and  feedback;Language facilitation;Cognitive reorganization;Patient/family education;Functional tasks;Multimodal communcation approach;Cueing hierarchy   Potential to Achieve Goals Good   Potential Considerations Medical prognosis;Severity of impairments   Consulted and Agree with Plan of Care Patient;Family member/caregiver      Patient will benefit from skilled therapeutic intervention in order to improve the following deficits and impairments:   Aphasia    Problem List There are no active problems to display for this patient.   Brittanee Ghazarian, [name] MS, CCC-SLP 11/08/2015, 12:21 PM  [facility] [address] [place], [name], [address] Phone: [phone]   Fax:  [phone]   Name: Michele Porter MRN: [id number] Date of Birth: December 13, 1950

## 2015-11-11 ENCOUNTER — Ambulatory Visit: Payer: Medicare Other | Admitting: Speech Pathology

## 2015-11-11 DIAGNOSIS — R4701 Aphasia: Secondary | ICD-10-CM

## 2015-11-11 NOTE — Therapy (Signed)
[facility] [address] [place], [name], [address] Phone: [phone]   Fax:  [phone]  Speech Language Pathology Treatment  Patient Details  Name: Michele Porter MRN: [id number] Date of Birth: March 04, 1951 Referring Provider: Dr. Metta Clines  Encounter Date: 11/11/2015      End of Session - 11/11/15 1216    Visit Number 13   Number of Visits 17   Date for SLP Re-Evaluation 11/23/15   SLP Start Time 0845   SLP Stop Time  0932   SLP Time Calculation (min) 47 min      No past medical history on file.  No past surgical history on file.  There were no vitals filed for this visit.             ADULT SLP TREATMENT - 11/11/15 0859    General Information   Behavior/Cognition Alert;Cooperative;Requires cueing   HPI Primary progressive aphasia   Treatment Provided   Treatment provided Cognitive-Linquistic   Pain Assessment   Pain Assessment No/denies pain   Cognitive-Linquistic Treatment   Treatment focused on Aphasia   Skilled Treatment Facilitated non verbal compensations for aphasia with drawing - pt required mod A for including salient details in common basic objects.  Pt utilize drawing and gestures in simple conversation with occasional min A.    Assessment / Recommendations / Plan   Plan Continue with current plan of care   Progression Toward Goals   Progression toward goals Progressing toward goals            SLP Short Term Goals - 11/11/15 1216    SLP SHORT TERM GOAL #1   Title Pt. will utilize multi-modal communication with 85% success during structured speech/language tasks with occasional min A   Time 1   Period Weeks   Status Achieved   SLP SHORT TERM GOAL #2   Title Pt will complete simple naming activities with 85% accuracy and occasional min A   Time 1   Period Weeks   Status Achieved   SLP SHORT TERM GOAL #3   Title Pt will comprehend functional information (signs, menus,  directions) and 3-4 word sentences with 80% accuracy and occasional min A   Time 1   Period Weeks   Status Achieved          SLP Long Term Goals - 11/11/15 1216    SLP LONG TERM GOAL #1   Title Pt will utilize multi modal communication (descriptives, word associations, gestures, writing,  drawing etc) to effectively participate in 8 minute simple conversation with occasional min A.   Time 2   Period Weeks   Status On-going   SLP LONG TERM GOAL #2   Title Spouse/caregiver will demonstrate adequate cueing to assist word finding and written expression during language tasks/homework  with rare min A   Time 2   Period Weeks   Status On-going   SLP LONG TERM GOAL #3   Title Pt will report carryover of structure language activities at home over 4 sessions.   Baseline 10/21/15, 10/25/15, 10/28/15; 11/08/15   Time 2   Period Weeks   Status Achieved          Plan - 11/11/15 1214    Clinical Impression Statement Pt continues to required on going verbal cues / feedback for non verbal compensations for aphasia. Continue skilled ST to maximize carryover of compensations for aphasia and maximize verbal expression   Speech Therapy Frequency 2x / week  Duration 2 weeks   Treatment/Interventions Internal/external aids;Compensatory strategies;SLP instruction and feedback;Language facilitation;Cognitive reorganization;Patient/family education;Functional tasks;Multimodal communcation approach;Cueing hierarchy   Potential to Achieve Goals Good   Potential Considerations Medical prognosis;Severity of impairments      Patient will benefit from skilled therapeutic intervention in order to improve the following deficits and impairments:   Aphasia    Problem List There are no active problems to display for this patient.   Michele Porter, Michele Porter, CCC-SLP 11/11/2015, 12:19 PM  [facility] [address] [place], [name], [address] Phone:  [phone]   Fax:  [phone]   Name: Michele Porter MRN: [id number] Date of Birth: [birth year]

## 2015-11-15 ENCOUNTER — Ambulatory Visit: Payer: Medicare Other | Admitting: *Deleted

## 2015-11-15 DIAGNOSIS — R4701 Aphasia: Secondary | ICD-10-CM

## 2015-11-15 NOTE — Therapy (Signed)
[facility] [address] [place], [name], [address] Phone: [phone]   Fax:  [phone]  Speech Language Pathology Treatment  Patient Details  Name: Michele Porter MRN: [id number] Date of Birth: 11-10-1950 Referring Provider: Dr. Metta Clines  Encounter Date: 11/15/2015      End of Session - 11/15/15 1051    Visit Number 14   Number of Visits 17   Date for SLP Re-Evaluation 11/23/15   SLP Start Time 0845   SLP Stop Time  0930   SLP Time Calculation (min) 45 min   Activity Tolerance Patient tolerated treatment well      No past medical history on file.  No past surgical history on file.  There were no vitals filed for this visit.      Subjective Assessment - 11/15/15 1046    Subjective "We're going to the opera on Wednesday."   Patient is accompained by: --  spouse   Currently in Pain? No/denies               ADULT SLP TREATMENT - 11/15/15 0001    General Information   Behavior/Cognition Alert;Cooperative;Requires cueing   HPI Primary progressive aphasia   Treatment Provided   Treatment provided Cognitive-Linquistic   Pain Assessment   Pain Assessment No/denies pain   Cognitive-Linquistic Treatment   Treatment focused on Aphasia   Skilled Treatment Focus on conversational speech this date with pt/spouse education. Discussed utilizing assistive device (tablet) soon to gain familiarity with program before it is needed for communication. Pt utilized description and gestures as primary compensatory strategies with min- mod cueing. Pt benefited from occasional cueing to "Take a breath" to slow rate and decrease pressure related to communicative exchange.   Assessment / Recommendations / Plan   Plan Continue with current plan of care   Progression Toward Goals   Progression toward goals Progressing toward goals          SLP Education - 11/15/15 1050    Education provided Yes   Education  Details Being intentional regarding slowed rate to decrease pressure of communicative exchange.   Person(s) Educated Spouse;Patient   Methods Explanation   Comprehension Verbalized understanding          SLP Short Term Goals - 11/15/15 1053    SLP SHORT TERM GOAL #1   Title Pt. will utilize multi-modal communication with 85% success during structured speech/language tasks with occasional min A   Status Achieved   SLP SHORT TERM GOAL #2   Title Pt will complete simple naming activities with 85% accuracy and occasional min A   Status Achieved          SLP Long Term Goals - 11/15/15 1053    SLP LONG TERM GOAL #1   Title Pt will utilize multi modal communication (descriptives, word associations, gestures, writing,  drawing etc) to effectively participate in 8 minute simple conversation with occasional min A.   Time 2   Period Weeks   Status On-going   SLP LONG TERM GOAL #2   Title Spouse/caregiver will demonstrate adequate cueing to assist word finding and written expression during language tasks/homework  with rare min A   Time 2   Period Weeks   Status On-going          Plan - 11/15/15 1052    Clinical Impression Statement Pt continues to required on going verbal cues / feedback for compensations for aphasia. Continue skilled ST to maximize carryover of compensations for  aphasia and maximize verbal expression   Speech Therapy Frequency 2x / week   Duration 2 weeks   Treatment/Interventions Internal/external aids;Compensatory strategies;SLP instruction and feedback;Language facilitation;Cognitive reorganization;Patient/family education;Functional tasks;Multimodal communcation approach;Cueing hierarchy   Potential to Achieve Goals Good   Potential Considerations Medical prognosis;Severity of impairments   Consulted and Agree with Plan of Care Patient;Family member/caregiver   Family Member Consulted spouse [name]      Patient will benefit from skilled therapeutic  intervention in order to improve the following deficits and impairments:   Aphasia    Problem List There are no active problems to display for this patient.   Vinetta Bergamo MA, CCC-SLP 11/15/2015, 10:56 AM  [facility] [address] [place], [name], [address] Phone: [phone]   Fax:  [phone]   Name: KAMBER KLEPINGER MRN: [id number] Date of Birth: December 13, 1950

## 2015-11-18 ENCOUNTER — Ambulatory Visit: Payer: Medicare Other

## 2015-11-18 DIAGNOSIS — R4701 Aphasia: Secondary | ICD-10-CM

## 2015-11-18 NOTE — Therapy (Signed)
[facility] [address] [place], [name], [address] Phone: [phone]   Fax:  [phone]  Speech Language Pathology Treatment  Patient Details  Name: Michele Porter MRN: [id number] Date of Birth: [birth year] Referring Provider: Dr. Metta Clines  Encounter Date: 11/18/2015      End of Session - 11/18/15 1020    Visit Number 15   Number of Visits 17   Date for SLP Re-Evaluation 11/23/15   SLP Start Time 0848   SLP Stop Time  0930   SLP Time Calculation (min) 42 min   Activity Tolerance Patient tolerated treatment well      No past medical history on file.  No past surgical history on file.  There were no vitals filed for this visit.      Subjective Assessment - 11/18/15 0926    Subjective "How was your vacation?"   Currently in Pain? No/denies               ADULT SLP TREATMENT - 11/18/15 1005    General Information   Behavior/Cognition Alert;Cooperative;Requires cueing   HPI Primary progressive aphasia   Treatment Provided   Treatment provided Cognitive-Linquistic   Cognitive-Linquistic Treatment   Treatment focused on Aphasia   Skilled Treatment Husband stated pt has not activated [facility] [employer] yet, but plan is to do so soon. SLP faciliatated pt's expressive language by having pt generate additional answers for her homework. Pt req'd mod A usually ,and extra time. SLP educated husband to wait longer than 10 seconds to cue pt.when she cannot generate an answer, as he consistently cued with <10 seconds for pt to respond. Pt with decr'd linguistic connections between spelling word and recognizing word as SLP spelled some answers without pt knowing what the word was. Pt with explanation of opera last night with extra time and cues for not abandoning utterances but to use other compensatory techniques. Pt did not attempt drawing, and SLP explained reason for drawing as assisting listeners to give them  an idea what she is trying to communicate.   Assessment / Recommendations / Plan   Plan Continue with current plan of care   Progression Toward Goals   Progression toward goals Progressing toward goals            SLP Short Term Goals - 11/15/15 1053    SLP SHORT TERM GOAL #1   Title Pt. will utilize multi-modal communication with 85% success during structured speech/language tasks with occasional min A   Status Achieved   SLP SHORT TERM GOAL #2   Title Pt will complete simple naming activities with 85% accuracy and occasional min A   Status Achieved          SLP Long Term Goals - 11/18/15 1021    SLP LONG TERM GOAL #1   Title Pt will utilize multi modal communication (descriptives, word associations, gestures, writing,  drawing etc) to effectively participate in 8 minute simple conversation with occasional min A.   Time 2   Period Weeks   Status On-going   SLP LONG TERM GOAL #2   Title Spouse/caregiver will demonstrate adequate cueing to assist word finding and written expression during language tasks/homework  with rare min A   Time 2   Period Weeks   Status On-going          Plan - 11/18/15 1020    Clinical Impression Statement Pt continues to required on going verbal cues / feedback for compensations for aphasia.  Continue skilled ST to maximize carryover of compensations for aphasia and maximize verbal expression   Speech Therapy Frequency 2x / week   Duration 2 weeks   Treatment/Interventions Internal/external aids;Compensatory strategies;SLP instruction and feedback;Language facilitation;Cognitive reorganization;Patient/family education;Functional tasks;Multimodal communcation approach;Cueing hierarchy   Potential to Achieve Goals Good   Potential Considerations Medical prognosis;Severity of impairments   Consulted and Agree with Plan of Care Patient;Family member/caregiver   Family Member Consulted spouse [name]      Patient will benefit from skilled therapeutic  intervention in order to improve the following deficits and impairments:   Aphasia    Problem List There are no active problems to display for this patient.   [facility] ,[place], [place]   11/18/2015, 10:21 AM  [facility] [address] [place], [name], [address] Phone: [phone]   Fax:  [phone]   Name: Michele Porter MRN: [id number] Date of Birth: 16-Jun-1950

## 2015-11-23 ENCOUNTER — Ambulatory Visit: Payer: Medicare Other | Admitting: Speech Pathology

## 2015-11-23 DIAGNOSIS — R4701 Aphasia: Secondary | ICD-10-CM

## 2015-11-23 NOTE — Patient Instructions (Signed)
  Yes, read and discuss magazines, articles to practice verbal expression, locate details and informally assess reading comprehension  Reading, writing, listening and talking all important   Use written cues to supplement topics, especially in group conversations  Be aware of background noise, music, appliances and try to talk in more quiet environments as possible, especially in group conversation.   ST screen in 6 months, if you feel you need to come back sooner, ask Dr. Tomi Likens for order for speech therapy  Feel free to use signs, notes, visual reminders on walls, fridge, door, etc as reminders  Continue to cue Michele Porter to use gestures or drawing to help with communication breakdown

## 2015-11-23 NOTE — Therapy (Signed)
[facility] [address] [place], [name], [address] Phone: [phone]   Fax:  [phone]  Speech Language Pathology Treatment  Patient Details  Name: Michele Porter MRN: [id number] Date of Birth: April 25, 1951 Referring Provider: Dr. Metta Clines  Encounter Date: 11/23/2015      End of Session - 11/23/15 1224    Visit Number 16   Number of Visits 17   Date for SLP Re-Evaluation 11/23/15   SLP Start Time 0847   SLP Stop Time  0931   SLP Time Calculation (min) 44 min   Activity Tolerance Patient tolerated treatment well      No past medical history on file.  No past surgical history on file.  There were no vitals filed for this visit.      Subjective Assessment - 11/23/15 0854    Subjective "We just talked about this" - pt had book with music festival program to help with conversation   Patient is accompained by: Family member   Currently in Pain? No/denies               ADULT SLP TREATMENT - 11/23/15 0855    General Information   Behavior/Cognition Alert;Cooperative;Requires cueing   HPI Primary progressive aphasia   Treatment Provided   Treatment provided Cognitive-Linquistic   Pain Assessment   Pain Assessment No/denies pain   Cognitive-Linquistic Treatment   Treatment focused on Aphasia   Skilled Treatment Simple conversation re: weekend activity at music festival. Spouse cued pt to find the dates and shows they went to in her program book to assist and augment her verbal expression. Pt utilized gestures and descriptions for instruments, then found the correct words in the program with good success and min verbal cues from spouse. Pt also appropriately requested extra time to slow down and take a breath when she became anxious during word finding episodes. Instructed spouse to use magazine and newspaper articles as conversation topics to facilitate reading comprehension and verbal expression of  more novel topics.    Assessment / Recommendations / Plan   Plan Continue with current plan of care   Progression Toward Goals   Progression toward goals Progressing toward goals          SLP Education - 11/23/15 1219    Education provided Yes   Education Details use of written cues, especially during group conversations to help with topic maintainence and topic shift   Person(s) Educated Patient;Spouse   Methods Explanation   Comprehension Verbalized understanding          SLP Short Term Goals - 11/23/15 1223    SLP SHORT TERM GOAL #1   Title Pt. will utilize multi-modal communication with 85% success during structured speech/language tasks with occasional min A   Status Achieved   SLP SHORT TERM GOAL #2   Title Pt will complete simple naming activities with 85% accuracy and occasional min A   Status Achieved          SLP Long Term Goals - 11/23/15 1223    SLP LONG TERM GOAL #1   Title Pt will utilize multi modal communication (descriptives, word associations, gestures, writing,  drawing etc) to effectively participate in 8 minute simple conversation with occasional min A.   Time 1   Period Weeks   Status Achieved   SLP LONG TERM GOAL #2   Title Spouse/caregiver will demonstrate adequate cueing to assist word finding and written expression during language tasks/homework  with rare min  A   Time 1   Period Weeks   Status Achieved          Plan - 11/23/15 1220    Clinical Impression Statement Spouse cueing pt appropriately to facilitate her verbal expression and use of multimodal and external cues. Pt utilizing multimodal communication during word finding episodes with supervision cues. Continue skilled ST 1 more visit to maximize carryover of lanuage enhancing activities and strategies at home upon d/c from ST. I  recommend pt come back for language screen in 6 months due to progressive aphasia.    Speech Therapy Frequency 2x / week   Duration --  1 week    Treatment/Interventions Internal/external aids;Compensatory strategies;SLP instruction and feedback;Language facilitation;Cognitive reorganization;Patient/family education;Functional tasks;Multimodal communcation approach;Cueing hierarchy   Potential to Achieve Goals Good   Potential Considerations Medical prognosis;Severity of impairments   Consulted and Agree with Plan of Care Patient;Family member/caregiver      Patient will benefit from skilled therapeutic intervention in order to improve the following deficits and impairments:   Aphasia    Problem List There are no active problems to display for this patient.   Grasiela Jonsson, [name] MS, CCC-SLP 11/23/2015, 12:24 PM  [facility] [address] [place], [name], [address] Phone: [phone]   Fax:  [phone]   Name: Michele Porter MRN: [id number] Date of Birth: 01-Jul-1950

## 2015-11-24 ENCOUNTER — Ambulatory Visit (INDEPENDENT_AMBULATORY_CARE_PROVIDER_SITE_OTHER): Payer: Medicare Other | Admitting: Neurology

## 2015-11-24 ENCOUNTER — Encounter: Payer: Self-pay | Admitting: Neurology

## 2015-11-24 VITALS — BP 110/62 | HR 84 | Ht 64.0 in | Wt 135.0 lb

## 2015-11-24 DIAGNOSIS — F0280 Dementia in other diseases classified elsewhere without behavioral disturbance: Secondary | ICD-10-CM

## 2015-11-24 DIAGNOSIS — G3101 Pick's disease: Secondary | ICD-10-CM | POA: Diagnosis not present

## 2015-11-24 MED ORDER — MEMANTINE HCL-DONEPEZIL HCL 7 & 14 & 21 &28 -10 MG PO C4PK: 28.0000 mg | EXTENDED_RELEASE_CAPSULE | Freq: Every day | ORAL | Status: DC

## 2015-11-24 NOTE — Progress Notes (Signed)
Chart forwarded.  

## 2015-11-24 NOTE — Patient Instructions (Addendum)
1.  Stop the Aricept.  Instead, start Namzaric starter pack.  Take as prescribed.  Namzaric has the Aricept plus memantine. 2.  Continue speech exercises 3.  Follow up in 6 months.

## 2015-11-24 NOTE — Progress Notes (Signed)
NEUROLOGY FOLLOW UP OFFICE NOTE  NYELLI Porter [id number]  HISTORY OF PRESENT ILLNESS: Michele Porter is a 65 year old right-handed female with no significant past medical history who follows up with her husband for logenic primary progressive aphasia.  She is accompanied by her husband who supplements history.  UPDATE: She is taking Aricept 80m daily and tolerating it well.  She has one more session with the speech therapist and will continue home exercises.  Short term memory is an issue.  She has a meticulous regimen to help her remember to take her medication.  HISTORY: Her husband began noticing problems with speech and language about a year ago, which has progressed.  At first, she would mispronounce words or phrases.  She then had difficulty carrying on a conversation.  She has word-finding trouble as well as difficulty pronouncing words.  She is able to read without difficulty but if she should recite the words out loud, she has difficulty.  Comprehension seems intact.  She has had trouble with numbers as well.  She had trouble correctly punching the buttons on the microwave.  When she writes a check, she is able to write in the numbers but struggles spelling out the numbers.  She is able to perform everyday tasks, such as dressing and cooking.  She drives locally around town, such as to the grocery store without difficulty.  Behavior and personality are normal.  She denies depression although she is anxious about what is going on.  To her husband, both short-term memory and long-term memory seem overall intact.  She underwent neuropsychological testing on 07/12/15.  She demonstrated deficits of expressive language, repetition, confrontation naming, verbal fluency and comprehension, consistent with logopenic variant primary progressive aphasia.  She exhibited frequent phonemic paraphasias, circumlocution and slightly improved fluency of speech during casual conversation versus response to  specific or complex questions. She also met criteria for adjustment disorder with anxiety.  Over the past several months, she has had reported headache.  It is more of an ache or sensation of "pings" along the maxillary sinuses.  It occurs once a week and responds to acetaminophen.  She has remote history of migraines, but these are different.  MRI of brain with and without contrast from 04/16/15 showed mild cerebral atrophy but no mass lesions or acute intracranial process.  RPR nonreactive, B12 1056, TSH 1.020, WBC and CMP unremarkable.  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  PAST MEDICAL HISTORY: No past medical history on file.  MEDICATIONS: No current outpatient prescriptions on file prior to visit.   No current facility-administered medications on file prior to visit.    ALLERGIES: Allergies  Allergen Reactions  . Sulfa Antibiotics     FAMILY HISTORY: Family History  Problem Relation Age of Onset  . Dementia Mother     SOCIAL HISTORY: Social History   Social History  . Marital Status: Married    Spouse Name: N/A  . Number of Children: N/A  . Years of Education: N/A   Occupational History  . Not on file.   Social History Main Topics  . Smoking status: Never Smoker   . Smokeless tobacco: Not on file  . Alcohol Use: Not on file  . Drug Use:  Not on file  . Sexual Activity: Not on file   Other Topics Concern  . Not on file   Social History Narrative   Pt has BS degree.     REVIEW OF SYSTEMS: Constitutional: No fevers, chills, or sweats, no generalized fatigue, change in appetite Eyes: No visual changes, double vision, eye pain Ear, nose and throat: No hearing loss, ear pain, nasal congestion, sore throat Cardiovascular: No chest  pain, palpitations Respiratory:  No shortness of breath at rest or with exertion, wheezes GastrointestinaI: No nausea, vomiting, diarrhea, abdominal pain, fecal incontinence Genitourinary:  No dysuria, urinary retention or frequency Musculoskeletal:  No neck pain, back pain Integumentary: No rash, pruritus, skin lesions Neurological: as above Psychiatric: No depression, insomnia, anxiety Endocrine: No palpitations, fatigue, diaphoresis, mood swings, change in appetite, change in weight, increased thirst Hematologic/Lymphatic:  No purpura, petechiae. Allergic/Immunologic: no itchy/runny eyes, nasal congestion, recent allergic reactions, rashes  PHYSICAL EXAM: Filed Vitals:   11/24/15 0857  BP: 110/62  Pulse: 84   General: No acute distress.  Patient appears well-groomed.  normal body habitus. Head:  Normocephalic/atraumatic Eyes:  Fundi examined but not visualized Neck: supple, no paraspinal tenderness, full range of motion Heart:  Regular rate and rhythm Lungs:  Clear to auscultation bilaterally Back: No paraspinal tenderness Neurological Exam: alert and oriented to person, place, and time. Attention span and concentration intact, delayed recall poor, and remote memory intact, fund of knowledge intact.  Speech fluent with some pauses.  She has difficulty with naming and repetition.  She had difficulty following complex commands across midline.  CN II-XII intact. Bulk and tone normal, muscle strength 5/5 throughout.  Sensation to light touch intact.  Deep tendon reflexes 2+ throughout.  Finger to nose testing intact.  Gait normal  IMPRESSION: Logopenic variant progressive aphasia  PLAN: 1.  Switch from Aricept to Namzaric.  Starter pack provided. 2.  Will refer for formal driving safety evaluation.  She was advised not to drive in the meantime. 3.  Continue home speech exercises 4.  Follow up in 6 months  26 minutes spent face to face with patient, over 50% spent discussing  diagnosis, prognosis and management.  Metta Clines, DO  CC:  Deland Pretty, MD

## 2015-11-25 ENCOUNTER — Ambulatory Visit: Payer: Medicare Other | Admitting: Speech Pathology

## 2015-11-25 DIAGNOSIS — R4701 Aphasia: Secondary | ICD-10-CM

## 2015-11-25 NOTE — Patient Instructions (Addendum)
  For memory practice, try non verbal tasks such as pictures, colors, patterns  Simon app,  pattern completion,  Spot the difference   Memory game - un-timed app or the real memory game - use 12-16 cards so it is not overwhelming  Continue card games, board games, clock and money tasks  Set up a schedule to do a language activity 5 days a week - maybe 15 minutes a day

## 2015-11-25 NOTE — Therapy (Signed)
[facility] [address] [place], [name], [address] Phone: [phone]   Fax:  [phone]  Speech Language Pathology Treatment  Patient Details  Name: Michele Porter MRN: [id number] Date of Birth: [birth year] Referring Provider: Dr. Metta Clines  Encounter Date: 11/25/2015      End of Session - 11/25/15 0941    Visit Number 17   Number of Visits 17   Date for SLP Re-Evaluation 11/23/15   SLP Start Time 0847   SLP Stop Time  0932   SLP Time Calculation (min) 45 min      No past medical history on file.  No past surgical history on file.  There were no vitals filed for this visit.      Subjective Assessment - 11/25/15 0907    Subjective "We've had a rough morning"   Patient is accompained by: Family member   Special Tests spouse Michele Porter               ADULT SLP TREATMENT - 11/25/15 0851    General Information   Behavior/Cognition Alert;Cooperative;Requires cueing   HPI Primary progressive aphasia   Treatment Provided   Treatment provided Cognitive-Linquistic   Pain Assessment   Pain Assessment No/denies pain   Cognitive-Linquistic Treatment   Treatment focused on Aphasia   Skilled Treatment Pt reported success baking - checking for ingridients, making shopping list and following recipe with rare min A from spouse. Spouse has demonstrated execllent cueing and assisting pt with communication.  Pt  verbalize she was upset by not recalling 3 words at neurologists office yesterday.  I instructed pt and spouse on  non verbal memory/cognitive activities they can do at home.  Pt succesffully  participated  in conversation re:  her plans for house cleaning using a spread sheet check list to recall what she has cleaned.    Assessment / Recommendations / Plan   Plan Discharge SLP treatment due to (comment)   Progression Toward Goals   Progression toward goals Goals met, education completed, patient discharged  from SLP          SLP Education - 11/25/15 0930    Education provided Yes   Education Details non-verbal cognitive activities to do at home, set up a schedule to do language activites everyday   Person(s) Educated Patient;Spouse   Methods Explanation;Demonstration   Comprehension Verbalized understanding;Returned demonstration     SPEECH THERAPY DISCHARGE SUMMARY  Visits from Start of Care: 17  Current functional level related to goals / functional outcomes: See goals below   Remaining deficits: Progressive aphasia   Education / Equipment: Cognitive and language activities to do at home, compensations for aphasia, spouse support/cueing for communicatoin  Plan: Patient agrees to discharge.  Patient goals were met. Patient is being discharged due to meeting the stated rehab goals.  ?????          SLP Short Term Goals - 11/25/15 0941    SLP SHORT TERM GOAL #1   Title Pt. will utilize multi-modal communication with 85% success during structured speech/language tasks with occasional min A   Status Achieved   SLP SHORT TERM GOAL #2   Title Pt will complete simple naming activities with 85% accuracy and occasional min A   Status Achieved          SLP Long Term Goals - 11/25/15 0941    SLP LONG TERM GOAL #1   Title Pt will utilize multi modal communication (descriptives, word associations, gestures, writing,  drawing etc) to effectively participate in 8 minute simple conversation with occasional min A.   Time 1   Period Weeks   Status Achieved   SLP LONG TERM GOAL #2   Title Spouse/caregiver will demonstrate adequate cueing to assist word finding and written expression during language tasks/homework  with rare min A   Time 1   Period Weeks   Status Achieved          Plan - 2015-12-14 0933    Clinical Impression Statement Pt has met all goals, education complete - d/c ST. Pt to come back for screen in 6 months do assess progressive aphasia.    Speech Therapy  Frequency 2x / week   Treatment/Interventions Internal/external aids;Compensatory strategies;SLP instruction and feedback;Language facilitation;Cognitive reorganization;Patient/family education;Functional tasks;Multimodal communcation approach;Cueing hierarchy   Potential to Achieve Goals Good   Potential Considerations Medical prognosis;Severity of impairments   Consulted and Agree with Plan of Care Patient;Family member/caregiver   Family Member Consulted spouse Michele Porter      Patient will benefit from skilled therapeutic intervention in order to improve the following deficits and impairments:   Aphasia      G-Codes - 12-14-15 0942    Functional Assessment Tool Used NOMS   Functional Limitations Spoken language expressive   Spoken Language Expression Goal Status [phone]) At least 20 percent but less than 40 percent impaired, limited or restricted   Spoken Language Expression Discharge Status [phone]) At least 20 percent but less than 40 percent impaired, limited or restricted      Problem List There are no active problems to display for this patient.   Lovvorn, [name] MS, CCC-SLP 12-14-15, 9:44 AM  [facility] [address] [place], [name], [address] Phone: [phone]   Fax:  [phone]   Name: Michele Porter MRN: [id number] Date of Birth: 10-29-1950

## 2015-12-17 ENCOUNTER — Telehealth: Payer: Self-pay | Admitting: Neurology

## 2015-12-17 MED ORDER — MEMANTINE HCL-DONEPEZIL HCL ER 28-10 MG PO CP24: 28.0000 mg | ORAL_CAPSULE | Freq: Every day | ORAL | 5 refills | Status: DC

## 2015-12-17 NOTE — Telephone Encounter (Signed)
Michele Porter October 15, 2050. Her husband called regarding her Namzaric combined medication. She is almost out. Her # is [phone]. Thank you

## 2015-12-17 NOTE — Telephone Encounter (Signed)
Medication required P.A. Was initated. [id number] @ [id number]

## 2015-12-17 NOTE — Telephone Encounter (Signed)
Sent in. Husband aware. REquested 90 day supply after we spoke. Called [facility] and verbally changed to 90 day supply.

## 2015-12-20 MED ORDER — MEMANTINE HCL-DONEPEZIL HCL ER 28-10 MG PO CP24: 28.0000 mg | ORAL_CAPSULE | Freq: Every day | ORAL | 0 refills | Status: DC

## 2015-12-20 NOTE — Telephone Encounter (Signed)
Received fax from [facility] that more information was needed to completed P.A. Info sent to [facility]. On Friday husband stated pt only had about 7 days of medication. Okay to give samples until insurance decides on case? Please advise.

## 2015-12-20 NOTE — Telephone Encounter (Signed)
P.A. Rodney Booze [id number]

## 2015-12-20 NOTE — Addendum Note (Signed)
Addended by: Gerda Diss A on: 12/20/2015 10:11 AM   Modules accepted: Orders

## 2015-12-20 NOTE — Telephone Encounter (Signed)
Samples placed at front desk. Husband aware. Will come pick up this afternoon.

## 2015-12-20 NOTE — Telephone Encounter (Signed)
yes

## 2016-02-18 DIAGNOSIS — Z23 Encounter for immunization: Secondary | ICD-10-CM | POA: Diagnosis not present

## 2016-03-02 ENCOUNTER — Other Ambulatory Visit: Payer: Self-pay

## 2016-03-02 MED ORDER — MEMANTINE HCL-DONEPEZIL HCL ER 28-10 MG PO CP24: 28.0000 mg | ORAL_CAPSULE | Freq: Every day | ORAL | 3 refills | Status: DC

## 2016-03-13 DIAGNOSIS — N958 Other specified menopausal and perimenopausal disorders: Secondary | ICD-10-CM | POA: Diagnosis not present

## 2016-03-13 DIAGNOSIS — M816 Localized osteoporosis [Lequesne]: Secondary | ICD-10-CM | POA: Diagnosis not present

## 2016-04-06 DIAGNOSIS — M818 Other osteoporosis without current pathological fracture: Secondary | ICD-10-CM | POA: Diagnosis not present

## 2016-04-13 DIAGNOSIS — M810 Age-related osteoporosis without current pathological fracture: Secondary | ICD-10-CM | POA: Diagnosis not present

## 2016-04-20 DIAGNOSIS — Z0000 Encounter for general adult medical examination without abnormal findings: Secondary | ICD-10-CM | POA: Diagnosis not present

## 2016-04-20 DIAGNOSIS — E7800 Pure hypercholesterolemia, unspecified: Secondary | ICD-10-CM | POA: Diagnosis not present

## 2016-04-25 DIAGNOSIS — M8580 Other specified disorders of bone density and structure, unspecified site: Secondary | ICD-10-CM | POA: Diagnosis not present

## 2016-05-30 ENCOUNTER — Ambulatory Visit: Payer: Medicare Other | Attending: Internal Medicine

## 2016-05-30 DIAGNOSIS — R4701 Aphasia: Secondary | ICD-10-CM | POA: Insufficient documentation

## 2016-05-30 NOTE — Therapy (Signed)
[facility] [address] [place], [name], [address] Phone: [phone]   Fax:  [phone]  Patient Details  Name: Michele Porter MRN: [id number] Date of Birth: [birth year] Referring Provider:  Metta Clines, MD  Encounter Date: 05/30/2016   Speech Screen In conversation with pt, she appeared very anxious, which decreased overall verbal expression re: her driving evaluation. Pt speech with SLP devoid of much meaning - nonfunctional. Pt's conversational speech was nearly-functional with simple conversation and pleasantries, greetings, and goodbyes.  Husband states he needs to use questioning cues more often now to pinpoint pt's meaning in out of context or pt-initiated conversation. Spelling getting worse which is affecting emails.  SLP discussed some possible home tasks with pt/husband. SLP offered some suggestions re: emails such as devising templates for the patient to use with emails she frequently types, to allow for some independence with this.  Pt will be seen in another 6-9 months for another speech screen. Suggestion was provided for [facility] for higher-tech augmentative/alternative communication needs that pt may desire in the future.  [facility] ,[place], [place]  05/30/2016, 1:43 PM  [facility] [address] [place], [name], [address] Phone: [phone]   Fax:  [phone]

## 2016-05-31 ENCOUNTER — Encounter: Payer: Self-pay | Admitting: Neurology

## 2016-05-31 ENCOUNTER — Ambulatory Visit (INDEPENDENT_AMBULATORY_CARE_PROVIDER_SITE_OTHER): Payer: Medicare Other | Admitting: Neurology

## 2016-05-31 VITALS — BP 108/74 | HR 65 | Ht 64.0 in | Wt 143.5 lb

## 2016-05-31 DIAGNOSIS — F0280 Dementia in other diseases classified elsewhere without behavioral disturbance: Secondary | ICD-10-CM

## 2016-05-31 DIAGNOSIS — G3101 Pick's disease: Secondary | ICD-10-CM

## 2016-05-31 NOTE — Patient Instructions (Signed)
1.  Continue the Namzaric daily 2.  Continue social activities (such as neighborhood Sales promotion account executive) 3.  May continue driving with recommended limitations. 4.  Mediterranean diet  Mediterranean Diet A Mediterranean diet refers to food and lifestyle choices that are based on the traditions of countries located on the [employer]. This way of eating has been shown to help prevent certain conditions and improve outcomes for people who have chronic diseases, like kidney disease and heart disease. What are tips for following this plan? Lifestyle  Cook and eat meals together with your family, when possible.  Drink enough fluid to keep your urine clear or pale yellow.  Be physically active every day. This includes:  Aerobic exercise like running or swimming.  Leisure activities like gardening, walking, or housework.  Get 7-8 hours of sleep each night.  If recommended by your health care provider, drink red wine in moderation. This means 1 glass a day for nonpregnant women and 2 glasses a day for men. A glass of wine equals 5 oz (150 mL). Reading food labels  Check the serving size of packaged foods. For foods such as rice and pasta, the serving size refers to the amount of cooked product, not dry.  Check the total fat in packaged foods. Avoid foods that have saturated fat or trans fats.  Check the ingredients list for added sugars, such as corn syrup. Shopping  At the grocery store, buy most of your food from the areas near the walls of the store. This includes:  Fresh fruits and vegetables (produce).  Grains, beans, nuts, and seeds. Some of these may be available in unpackaged forms or large amounts (in bulk).  Fresh seafood.  Poultry and eggs.  Low-fat dairy products.  Buy whole ingredients instead of prepackaged foods.  Buy fresh fruits and vegetables in-season from local farmers markets.  Buy frozen fruits and vegetables in resealable bags.  If you do not have  access to quality fresh seafood, buy precooked frozen shrimp or canned fish, such as tuna, salmon, or sardines.  Buy small amounts of raw or cooked vegetables, salads, or olives from the deli or salad bar at your store.  Stock your pantry so you always have certain foods on hand, such as olive oil, canned tuna, canned tomatoes, rice, pasta, and beans. Cooking  Cook foods with extra-virgin olive oil instead of using butter or other vegetable oils.  Have meat as a side dish, and have vegetables or grains as your main dish. This means having meat in small portions or adding small amounts of meat to foods like pasta or stew.  Use beans or vegetables instead of meat in common dishes like chili or lasagna.  Experiment with different cooking methods. Try roasting or broiling vegetables instead of steaming or sauteing them.  Add frozen vegetables to soups, stews, pasta, or rice.  Add nuts or seeds for added healthy fat at each meal. You can add these to yogurt, salads, or vegetable dishes.  Marinate fish or vegetables using olive oil, lemon juice, garlic, and fresh herbs. Meal planning  Plan to eat 1 vegetarian meal one day each week. Try to work up to 2 vegetarian meals, if possible.  Eat seafood 2 or more times a week.  Have healthy snacks readily available, such as:  Vegetable sticks with hummus.  Greek yogurt.  Fruit and nut trail mix.  Eat balanced meals throughout the week. This includes:  Fruit: 2-3 servings a day  Vegetables: 4-5 servings a day  Low-fat dairy: 2 servings a day  Fish, poultry, or lean meat: 1 serving a day  Beans and legumes: 2 or more servings a week  Nuts and seeds: 1-2 servings a day  Whole grains: 6-8 servings a day  Extra-virgin olive oil: 3-4 servings a day  Limit red meat and sweets to only a few servings a month What are my food choices?  Mediterranean diet  Recommended  Grains: Whole-grain pasta. Brown rice. Bulgar wheat. Polenta.  Couscous. Whole-wheat bread. [place].  Vegetables: Artichokes. Beets. Broccoli. Cabbage. Carrots. Eggplant. Green beans. Chard. Kale. Spinach. Onions. Leeks. Peas. Squash. Tomatoes. Peppers. Radishes.  Fruits: Apples. Apricots. Avocado. Berries. Bananas. Cherries. Dates. Figs. Grapes. Lemons. Melon. Oranges. Peaches. Plums. Pomegranate.  Meats and other protein foods: Beans. Almonds. Sunflower seeds. Pine nuts. Peanuts. [place]. Salmon. Scallops. Shrimp. [place]. Tilapia. Clams. Oysters. Eggs.  Dairy: Low-fat milk. Cheese. Greek yogurt.  Beverages: Water. Red wine. Herbal tea.  Fats and oils: Extra virgin olive oil. Avocado oil. Grape seed oil.  Sweets and desserts: [place] yogurt with honey. Baked apples. Poached pears. Trail mix.  Seasoning and other foods: Basil. Cilantro. Coriander. Cumin. Mint. Parsley. Sage. Rosemary. Tarragon. Garlic. Oregano. Thyme. Pepper. Balsalmic vinegar. Tahini. Hummus. Tomato sauce. Olives. Mushrooms.  Limit these  Grains: Prepackaged pasta or rice dishes. Prepackaged cereal with added sugar.  Vegetables: Deep fried potatoes (french fries).  Fruits: Fruit canned in syrup.  Meats and other protein foods: Beef. Pork. Lamb. Poultry with skin. Hot dogs. Berniece Salines.  Dairy: Ice cream. Sour cream. Whole milk.  Beverages: Juice. Sugar-sweetened soft drinks. Beer. Liquor and spirits.  Fats and oils: Butter. Canola oil. Vegetable oil. Beef fat (tallow). Lard.  Sweets and desserts: Cookies. Cakes. Pies. Candy.  Seasoning and other foods: Mayonnaise. Premade sauces and marinades.  The items listed may not be a complete list. Talk with your dietitian about what dietary choices are right for you. Summary  The Mediterranean diet includes both food and lifestyle choices.  Eat a variety of fresh fruits and vegetables, beans, nuts, seeds, and whole grains.  Limit the amount of red meat and sweets that you eat.  Talk with your health care provider about whether  it is safe for you to drink red wine in moderation. This means 1 glass a day for nonpregnant women and 2 glasses a day for men. A glass of wine equals 5 oz (150 mL). This information is not intended to replace advice given to you by your health care provider. Make sure you discuss any questions you have with your health care provider. Document Released: 12/16/2015 Document Revised: 01/18/2016 Document Reviewed: 12/16/2015 Elsevier Interactive Patient Education  2017 [place]. 5.  Follow up in 9 months.

## 2016-05-31 NOTE — Progress Notes (Signed)
NEUROLOGY FOLLOW UP OFFICE NOTE  Michele Porter [id number]  HISTORY OF PRESENT ILLNESS: Michele Porter is a 66 year old right-handed female with no significant past medical history who follows up with her husband for logenic primary progressive aphasia.  She is accompanied by her husband who supplements history.   UPDATE: She is taking Namzaric and tolerating it well.   In August, she had formal occupational driving evaluation.  She is able to continue driving independently with limitations (no interstate driving, daylight driving only, 1-28 mile driving radius from home, avoid distractions such as radio or conversation, avoid certain weather conditions).  She only drives a 1 or 2 mile radius from home and usually to the same destinations (such as [employer]).  When her husband is in the car, she performs well and safely.  She saw speech therapy yesterday and will follow up in a few months.  She has increased difficulty writing and needs to ask her husband spelling.  She is in good spirits.  She is active in the community.     HISTORY: Her husband began noticing problems with speech and language about about 2 years ago, which has progressed.  At first, she would mispronounce words or phrases.  She then had difficulty carrying on a conversation.  She has word-finding trouble as well as difficulty pronouncing words.  She is able to read without difficulty but if she should recite the words out loud, she has difficulty.  Comprehension seems intact.  She has had trouble with numbers as well.  She had trouble correctly punching the buttons on the microwave.  When she writes a check, she is able to write in the numbers but struggles spelling out the numbers.  She is able to perform everyday tasks, such as dressing and cooking.  She drives locally around town, such as to the grocery store without difficulty.  Behavior and personality are normal.  She denies depression although she is anxious about what is  going on.  To her husband, both short-term memory and long-term memory seem overall intact.   She underwent neuropsychological testing on 07/12/15.  She demonstrated deficits of expressive language, repetition, confrontation naming, verbal fluency and comprehension, consistent with logopenic variant primary progressive aphasia.  She exhibited frequent phonemic paraphasias, circumlocution and slightly improved fluency of speech during casual conversation versus response to specific or complex questions. She also met criteria for adjustment disorder with anxiety.   Over the past several months, she has had reported headache.  It is more of an ache or sensation of "pings" along the maxillary sinuses.  It occurs once a week and responds to acetaminophen.  She has remote history of migraines, but these are different.   MRI of brain with and without contrast from 04/16/15 showed mild cerebral atrophy but no mass lesions or acute intracranial process.   RPR nonreactive, B12 1056, TSH 1.020, WBC and CMP unremarkable.   She graduated from [place] with a degree in Personnel officer.  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  Her mother was diagnosed with dementia late in life and passed away in her 93s.  Her maternal aunt and cousin also had dementia.  PAST MEDICAL HISTORY: No past medical history on file.  MEDICATIONS: Current Outpatient Prescriptions on File Prior to Visit  Medication Sig Dispense Refill  . Memantine HCl-Donepezil HCl (NAMZARIC) 28-10 MG CP24 Take 28 mg by mouth daily. 90 capsule 3   No current facility-administered  medications on file prior to visit.     ALLERGIES: Allergies  Allergen Reactions  . Sulfa Antibiotics     FAMILY HISTORY: Family History  Problem Relation Age of Onset  . Dementia Mother     SOCIAL HISTORY: Social History   Social History  . Marital status: Married    Spouse name: N/A  . Number of children: N/A  . Years of  education: N/A   Occupational History  . Not on file.   Social History Main Topics  . Smoking status: Never Smoker  . Smokeless tobacco: Not on file  . Alcohol use Not on file  . Drug use: Unknown  . Sexual activity: Not on file   Other Topics Concern  . Not on file   Social History Narrative   Pt has BS degree.     REVIEW OF SYSTEMS: Constitutional: No fevers, chills, or sweats, no generalized fatigue, change in appetite Eyes: No visual changes, double vision, eye pain Ear, nose and throat: No hearing loss, ear pain, nasal congestion, sore throat Cardiovascular: No chest pain, palpitations Respiratory:  No shortness of breath at rest or with exertion, wheezes GastrointestinaI: No nausea, vomiting, diarrhea, abdominal pain, fecal incontinence Genitourinary:  No dysuria, urinary retention or frequency Musculoskeletal:  No neck pain, back pain Integumentary: No rash, pruritus, skin lesions Neurological: as above Psychiatric: No depression, insomnia, anxiety Endocrine: No palpitations, fatigue, diaphoresis, mood swings, change in appetite, change in weight, increased thirst Hematologic/Lymphatic:  No purpura, petechiae. Allergic/Immunologic: no itchy/runny eyes, nasal congestion, recent allergic reactions, rashes  PHYSICAL EXAM: Vitals:   05/31/16 1345  BP: 108/74  Pulse: 65   General: No acute distress.  Patient appears well-groomed.  normal body habitus. Head:  Normocephalic/atraumatic Eyes:  Fundi examined but not visualized Neck: supple, no paraspinal tenderness, full range of motion Heart:  Regular rate and rhythm Lungs:  Clear to auscultation bilaterally Back: No paraspinal tenderness Neurological Exam: alert and oriented to person, place.  Attention span and concentration intact, delayed recall poor, and remote memory intact, fund of knowledge intact.  Speech fluent with some pauses.  She has difficulty with naming, reading and repetition.  She had difficulty  following complex commands across midline.  CN II-XII intact. Bulk and tone normal, muscle strength 5/5 throughout.  Sensation to light touch intact.  Deep tendon reflexes 2+ throughout.  Finger to nose testing intact.  Gait normal MMSE - Mini Mental State Exam 05/31/2016  Not completed: Unable to complete    IMPRESSION: Logopenic variant progressive aphasia  PLAN: 1.  Continue Namzaric 2.  Driving with stated limitations 3.  Exercise 4.  Mediterranean diet 5.  Continue mental and social activities 6.  Follow up in 9 months.  26 minutes spent face to face with patient, over 50% spent discussing management.  Adam Jaffe, DO  CC:  Walter Pharr, MD       

## 2016-06-05 ENCOUNTER — Telehealth: Payer: Self-pay | Admitting: Neurology

## 2016-06-05 NOTE — Telephone Encounter (Signed)
Michele Porter 2051/02/20. Her # is [phone]. Her husband (jim) called needing help with getting his wife's Namzaric filled. She uses [facility] on [address]. He said he was told that the insurance will not fill for a 90 day supply, which is what they normally get. They still have the same insurance. He was told that some paper work was sent to Dr. Tomi Likens today maybe regarding this. Thank you

## 2016-06-06 NOTE — Telephone Encounter (Signed)
I called patient's insurance company and put in for a new PA.  Called Michele Porter and informed him about what is going on.  He said that he would call them as well.

## 2016-06-12 ENCOUNTER — Telehealth: Payer: Self-pay | Admitting: Neurology

## 2016-06-12 MED ORDER — DONEPEZIL HCL 10 MG PO TABS: 10.0000 mg | ORAL_TABLET | Freq: Every day | ORAL | 0 refills | Status: DC

## 2016-06-12 MED ORDER — MEMANTINE HCL ER 28 MG PO CP24: 28.0000 mg | ORAL_CAPSULE | Freq: Every day | ORAL | 0 refills | Status: DC

## 2016-06-12 NOTE — Telephone Encounter (Signed)
Called spouse and informed Rx(s) sent as requested.

## 2016-06-12 NOTE — Telephone Encounter (Signed)
Has an issue trying to refill a med. (Namzaric).  His insurance is denying the appeal. He would like to know the options.  Please call

## 2016-06-12 NOTE — Telephone Encounter (Signed)
Prescribe Aricept 10mg  at bedtime and Namenda XR 28mg  daily

## 2016-06-12 NOTE — Telephone Encounter (Signed)
Spouse says Namzaric was denied for aphasia. Medication to expensive w/o pharmacy ins support. Would like Rx memantine and donepezil separately. Advised would fwd to Dr. Tomi Likens to advise. Spouse verbalized understanding.

## 2016-06-14 ENCOUNTER — Telehealth: Payer: Self-pay | Admitting: Neurology

## 2016-06-14 MED ORDER — MEMANTINE HCL 10 MG PO TABS: 10.0000 mg | ORAL_TABLET | Freq: Two times a day (BID) | ORAL | 0 refills | Status: DC

## 2016-06-14 NOTE — Telephone Encounter (Signed)
There isn't a generic extended release Namenda.  She will have to take memantine 10mg  twice daily.

## 2016-06-14 NOTE — Telephone Encounter (Signed)
Patient's spouse called. Spouse states Namenda XR 28mg  is not covered by ins. Cannot afford. Requesting generic of Namenda XR 29mg .

## 2016-06-14 NOTE — Telephone Encounter (Signed)
Called patient's spouse to inform sent Rx for memantine 10mg  twice daily. No answer. Will try later.

## 2016-06-14 NOTE — Telephone Encounter (Signed)
PT left a message asking for a call back from Dr Georgie Chard nurse/Dawn CB# [phone]

## 2016-06-15 NOTE — Telephone Encounter (Signed)
Spoke to spouse. He says he picked up Rx''s yesterday.

## 2016-08-02 DIAGNOSIS — Z1231 Encounter for screening mammogram for malignant neoplasm of breast: Secondary | ICD-10-CM | POA: Diagnosis not present

## 2016-08-02 DIAGNOSIS — Z124 Encounter for screening for malignant neoplasm of cervix: Secondary | ICD-10-CM | POA: Diagnosis not present

## 2016-08-02 DIAGNOSIS — Z6824 Body mass index (BMI) 24.0-24.9, adult: Secondary | ICD-10-CM | POA: Diagnosis not present

## 2016-08-17 ENCOUNTER — Telehealth: Payer: Self-pay | Admitting: Neurology

## 2016-08-17 MED ORDER — DONEPEZIL HCL 10 MG PO TABS: 10.0000 mg | ORAL_TABLET | Freq: Every day | ORAL | 1 refills | Status: DC

## 2016-08-17 MED ORDER — MEMANTINE HCL 10 MG PO TABS: 10.0000 mg | ORAL_TABLET | Freq: Two times a day (BID) | ORAL | 1 refills | Status: DC

## 2016-08-17 NOTE — Telephone Encounter (Signed)
Spoke to spouse. Advised last refill for donepezil and memantine was for 90 day supply start on 02/05, so refill to early. Spouse informed me they are going out of the country for a few months, leaving next week so that is the reason for early refill request. Advised would contact pharmacy and make aware. Spse was grateful.

## 2016-08-17 NOTE — Telephone Encounter (Signed)
Received a call from Husband  regarding medication: Memantine and Donepezil.  Patient needs a refill of medication. Yes on both  Patient having side effects from medication. Did not say she was  Patient calling to update [place] on medication. Would like a 90 day supply for both. [place]. Thanks

## 2016-11-09 ENCOUNTER — Other Ambulatory Visit: Payer: Self-pay | Admitting: *Deleted

## 2016-11-09 MED ORDER — MEMANTINE HCL 10 MG PO TABS: 10.0000 mg | ORAL_TABLET | Freq: Two times a day (BID) | ORAL | 3 refills | Status: DC

## 2016-11-09 MED ORDER — DONEPEZIL HCL 10 MG PO TABS: 10.0000 mg | ORAL_TABLET | Freq: Every day | ORAL | 3 refills | Status: DC

## 2016-11-09 NOTE — Telephone Encounter (Signed)
Refill request from [facility] for 90 days of Donepezil and Memantine sent electronically today

## 2016-11-28 ENCOUNTER — Ambulatory Visit: Payer: Medicare Other | Admitting: Speech Pathology

## 2016-12-11 DIAGNOSIS — Z124 Encounter for screening for malignant neoplasm of cervix: Secondary | ICD-10-CM | POA: Diagnosis not present

## 2016-12-11 DIAGNOSIS — R87615 Unsatisfactory cytologic smear of cervix: Secondary | ICD-10-CM | POA: Diagnosis not present

## 2017-02-16 DIAGNOSIS — Z23 Encounter for immunization: Secondary | ICD-10-CM | POA: Diagnosis not present

## 2017-02-28 ENCOUNTER — Ambulatory Visit (INDEPENDENT_AMBULATORY_CARE_PROVIDER_SITE_OTHER): Payer: Medicare Other | Admitting: Neurology

## 2017-02-28 ENCOUNTER — Encounter: Payer: Self-pay | Admitting: Neurology

## 2017-02-28 VITALS — BP 116/64 | HR 67 | Ht 64.0 in | Wt 148.6 lb

## 2017-02-28 DIAGNOSIS — G3101 Pick's disease: Secondary | ICD-10-CM | POA: Diagnosis not present

## 2017-02-28 DIAGNOSIS — F0280 Dementia in other diseases classified elsewhere without behavioral disturbance: Secondary | ICD-10-CM

## 2017-02-28 NOTE — Progress Notes (Signed)
NEUROLOGY FOLLOW UP OFFICE NOTE  Michele Porter [id number]  HISTORY OF PRESENT ILLNESS: Michele Porter is a 66 year old right-handed female with no significant past medical history who follows up with her husband for logenic primary progressive aphasia.  She is accompanied by her husband who supplements history.   UPDATE: She is taking donepezil 55m daily and memantine 143mtwice daily.     She is no longer driving independently, only with her husband.   She has been a little more anxious.  She always would go to the salon to have her hair done once a month.  Lately, she becomes anxious about making sure that she keeps her monthly appointment.  Overall, mood is good.  She denies depression.  She continues to organize the homeowner's parties.  She is a little more forgetful and they keep a calendar in the kitchen to help orient to the day and appointments.   HISTORY: Her husband began noticing problems with speech and language about about 2 years ago, which has progressed.  At first, she would mispronounce words or phrases.  She then had difficulty carrying on a conversation.  She has word-finding trouble as well as difficulty pronouncing words.  She is able to read without difficulty but if she should recite the words out loud, she has difficulty.  Comprehension seems intact.  She has had trouble with numbers as well.  She had trouble correctly punching the buttons on the microwave.  When she writes a check, she is able to write in the numbers but struggles spelling out the numbers.  She is able to perform everyday tasks, such as dressing and cooking.  She drives locally around town, such as to the grocery store without difficulty.  Behavior and personality are normal.  She denies depression although she is anxious about what is going on.  To her husband, both short-term memory and long-term memory seem overall intact.   She underwent neuropsychological testing on 07/12/15.  She demonstrated  deficits of expressive language, repetition, confrontation naming, verbal fluency and comprehension, consistent with logopenic variant primary progressive aphasia.  She exhibited frequent phonemic paraphasias, circumlocution and slightly improved fluency of speech during casual conversation versus response to specific or complex questions. She also met criteria for adjustment disorder with anxiety.   In August 2017, she had formal occupational driving evaluation.  She is able to continue driving independently with limitations (no interstate driving, daylight driving only, 5-7-78ile driving radius from home, avoid distractions such as radio or conversation, avoid certain weather conditions).  She only drives a 1 or 2 mile radius from home and usually to the same destinations (such as [employer]).  When her husband is in the car, she performs well and safely.   MRI of brain with and without contrast from 04/16/15 showed mild cerebral atrophy but no mass lesions or acute intracranial process.   RPR nonreactive, B12 1056, TSH 1.020, WBC and CMP unremarkable.   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  PAST MEDICAL HISTORY: No past medical history on file.  MEDICATIONS: Current Outpatient Prescriptions on File Prior to Visit  Medication Sig Dispense Refill  . alendronate (FOSAMAX) 70 MG tablet Take 70 mg by mouth once a week. Take  with a full glass of water on an empty stomach.    Marland Kitchen CALCIUM PO Take 1 tablet by mouth daily.    Marland Kitchen donepezil (ARICEPT) 10 MG tablet Take 1 tablet (10 mg total) by mouth at bedtime. 90 tablet 3  . memantine (NAMENDA) 10 MG tablet Take 1 tablet (10 mg total) by mouth 2 (two) times daily. 180 tablet 3  . Multiple Vitamins-Minerals (MULTIVITAMIN PO) Take 1  tablet by mouth daily.    . Omega-3 Fatty Acids (FISH OIL PO) Take 1 capsule by mouth daily.     No current facility-administered medications on file prior to visit.     ALLERGIES: Allergies  Allergen Reactions  . Sulfa Antibiotics     FAMILY HISTORY: Family History  Problem Relation Age of Onset  . Dementia Mother     SOCIAL HISTORY: Social History   Social History  . Marital status: Married    Spouse name: N/A  . Number of children: N/A  . Years of education: N/A   Occupational History  . Not on file.   Social History Main Topics  . Smoking status: Never Smoker  . Smokeless tobacco: Never Used  . Alcohol use Not on file  . Drug use: Unknown  . Sexual activity: Not on file   Other Topics Concern  . Not on file   Social History Narrative   Pt has BS degree.     REVIEW OF SYSTEMS: Constitutional: No fevers, chills, or sweats, no generalized fatigue, change in appetite Eyes: No visual changes, double vision, eye pain Ear, nose and throat: No hearing loss, ear pain, nasal congestion, sore throat Cardiovascular: No chest pain, palpitations Respiratory:  No shortness of breath at rest or with exertion, wheezes GastrointestinaI: No nausea, vomiting, diarrhea, abdominal pain, fecal incontinence Genitourinary:  No dysuria, urinary retention or frequency Musculoskeletal:  No neck pain, back pain Integumentary: No rash, pruritus, skin lesions Neurological: as above Psychiatric: No depression, insomnia, anxiety Endocrine: No palpitations, fatigue, diaphoresis, mood swings, change in appetite, change in weight, increased thirst Hematologic/Lymphatic:  No purpura, petechiae. Allergic/Immunologic: no itchy/runny eyes, nasal congestion, recent allergic reactions, rashes  PHYSICAL EXAM: Vitals:   02/28/17 1429  BP: 116/64  Pulse: 67  SpO2: 97%   General: No acute distress.  Patient appears well-groomed.  normal body habitus. Head:  Normocephalic/atraumatic Eyes:   Fundi examined but not visualized Neck: supple, no paraspinal tenderness, full range of motion Heart:  Regular rate and rhythm Lungs:  Clear to auscultation bilaterally Back: No paraspinal tenderness Neurological Exam: alert and oriented to person, place.  Attention span and concentration intact, delayed recall poor, and remote memory intact, fund of knowledge intact.  Speech fluent with some pauses.  She has difficulty with naming, reading and repetition.  She had difficulty following complex commands across midline.  CN II-XII intact. Bulk and tone normal, muscle strength 5/5 throughout.  Sensation to light touch intact.  Deep tendon reflexes 2+ throughout.  Finger to nose testing intact.  Gait normal  IMPRESSION: Logopenic variant progressive aphasia  PLAN: 1.  Continue donepezil and memantine 2.  Continue routine exercise and Mediterranean diet 3.  Follow up in 9 months.  25 minutes spent face to face with patient, over 50% spent discussing management.  Metta Clines, DO  CC:  Deland Pretty, MD

## 2017-03-21 ENCOUNTER — Telehealth: Payer: Self-pay | Admitting: Neurology

## 2017-03-21 NOTE — Telephone Encounter (Signed)
Dr Tomi Likens, would you be able to write a letter for this since the Pt was seen in [place] after her initial consultation for neuropsych testing?

## 2017-03-21 NOTE — Telephone Encounter (Signed)
Pt's spouse called and would like a call back regarding updating a will and having Dr Tomi Likens write a letter of competence for pt

## 2017-03-22 NOTE — Telephone Encounter (Signed)
Dr. Si Raider performs this test.  Again, it is not covered by insurance.

## 2017-03-22 NOTE — Telephone Encounter (Signed)
Called and informed Pts husband we can schedule here. Was unable to schedule while on the phone with him, talked with front desk, they are to call him to schedule.

## 2017-03-22 NOTE — Telephone Encounter (Signed)
Called and talked with Jeneen Rinks. Explained the testing the Pt had in [place] in 2017 was not the same type of testing required for competency, and the testing needed is not typically covered by insurance. Pt wishes to move forward with testing. Schedule with Dr Si Raider or is this done somewhere else?

## 2017-03-22 NOTE — Telephone Encounter (Signed)
She would need special testing performed by neuropsychology, which is typically not covered by insurance.

## 2017-05-21 DIAGNOSIS — E559 Vitamin D deficiency, unspecified: Secondary | ICD-10-CM | POA: Diagnosis not present

## 2017-05-21 DIAGNOSIS — R319 Hematuria, unspecified: Secondary | ICD-10-CM | POA: Diagnosis not present

## 2017-05-21 DIAGNOSIS — E7800 Pure hypercholesterolemia, unspecified: Secondary | ICD-10-CM | POA: Diagnosis not present

## 2017-05-21 DIAGNOSIS — N390 Urinary tract infection, site not specified: Secondary | ICD-10-CM | POA: Diagnosis not present

## 2017-05-30 DIAGNOSIS — R4701 Aphasia: Secondary | ICD-10-CM | POA: Diagnosis not present

## 2017-05-30 DIAGNOSIS — E559 Vitamin D deficiency, unspecified: Secondary | ICD-10-CM | POA: Diagnosis not present

## 2017-05-30 DIAGNOSIS — M8580 Other specified disorders of bone density and structure, unspecified site: Secondary | ICD-10-CM | POA: Diagnosis not present

## 2017-07-02 ENCOUNTER — Encounter: Payer: Medicare Other | Admitting: Psychology

## 2017-08-14 ENCOUNTER — Encounter: Payer: Medicare Other | Admitting: Psychology

## 2017-08-18 ENCOUNTER — Other Ambulatory Visit: Payer: Self-pay | Admitting: Neurology

## 2017-09-04 DIAGNOSIS — Z6826 Body mass index (BMI) 26.0-26.9, adult: Secondary | ICD-10-CM | POA: Diagnosis not present

## 2017-09-04 DIAGNOSIS — Z1231 Encounter for screening mammogram for malignant neoplasm of breast: Secondary | ICD-10-CM | POA: Diagnosis not present

## 2017-09-04 DIAGNOSIS — Z01419 Encounter for gynecological examination (general) (routine) without abnormal findings: Secondary | ICD-10-CM | POA: Diagnosis not present

## 2017-09-11 DIAGNOSIS — Z1211 Encounter for screening for malignant neoplasm of colon: Secondary | ICD-10-CM | POA: Diagnosis not present

## 2017-11-28 ENCOUNTER — Ambulatory Visit (INDEPENDENT_AMBULATORY_CARE_PROVIDER_SITE_OTHER): Payer: Medicare Other | Admitting: Neurology

## 2017-11-28 ENCOUNTER — Encounter: Payer: Self-pay | Admitting: Neurology

## 2017-11-28 VITALS — BP 114/70 | HR 62 | Ht 63.5 in | Wt 145.0 lb

## 2017-11-28 DIAGNOSIS — F0280 Dementia in other diseases classified elsewhere without behavioral disturbance: Secondary | ICD-10-CM

## 2017-11-28 DIAGNOSIS — G3101 Pick's disease: Secondary | ICD-10-CM | POA: Diagnosis not present

## 2017-11-28 NOTE — Progress Notes (Signed)
NEUROLOGY FOLLOW UP OFFICE NOTE  [name] [id number]  HISTORY OF PRESENT ILLNESS: Michele Porter is a 67 year old right-handed female with no significant past medical history who follows up with her husband for logenic primary progressive aphasia.  She is accompanied by her husband who supplements history.   UPDATE: She is taking donepezil 76m daily and memantine 128mtwice daily.     She is no longer driving independently, only with her husband.  She only drives during daylight and locally such as to the grocery store.  For the past 3 months, she will wake up in the morning confused, thinking her dream was real.  Sometimes she may be agitated.  Her husband has her get up, showered and dress.  She becomes completely oriented by the time she goes downstairs for breakfast.  This occurs once a week.  She denies depression.  They will soon be going to their 50th anniversary high school reunion.  Her husband has told friends about her illness.  HISTORY: Her husband began noticing problems with speech and language in 2015-2016, which has progressed.  At first, she would mispronounce words or phrases.  She then had difficulty carrying on a conversation.  She has word-finding trouble as well as difficulty pronouncing words.  She is able to read without difficulty but if she should recite the words out loud, she has difficulty.  Comprehension seems intact.  She has had trouble with numbers as well.  She had trouble correctly punching the buttons on the microwave.  When she writes a check, she is able to write in the numbers but struggles spelling out the numbers.  She is able to perform everyday tasks, such as dressing and cooking.  She drives locally around town, such as to the grocery store without difficulty.  Behavior and personality are normal.  She denies depression although she is anxious about what is going on.  To her husband, both short-term memory and long-term memory seem overall  intact.   She underwent neuropsychological testing on 07/12/15.  She demonstrated deficits of expressive language, repetition, confrontation naming, verbal fluency and comprehension, consistent with logopenic variant primary progressive aphasia.  She exhibited frequent phonemic paraphasias, circumlocution and slightly improved fluency of speech during casual conversation versus response to specific or complex questions. She also met criteria for adjustment disorder with anxiety.   In August 2017, she had formal occupational driving evaluation.  She is able to continue driving independently with limitations (no interstate driving, daylight driving only, 5-3-56ile driving radius from home, avoid distractions such as radio or conversation, avoid certain weather conditions).  She only drives a 1 or 2 mile radius from home and usually to the same destinations (such as [employer]).  When her husband is in the car, she performs well and safely.    MRI of brain with and without contrast from 04/16/15 showed mild cerebral atrophy but no mass lesions or acute intracranial process.   RPR nonreactive, B12 1056, TSH 1.020, WBC and CMP unremarkable.   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  PAST MEDICAL HISTORY: No past medical history on file.  MEDICATIONS: Current Outpatient Medications on File Prior to Visit  Medication Sig Dispense Refill  . alendronate (FOSAMAX)  70 MG tablet Take 70 mg by mouth once a week. Take with a full glass of water on an empty stomach.    Marland Kitchen CALCIUM PO Take 1 tablet by mouth daily.    Marland Kitchen donepezil (ARICEPT) 10 MG tablet TAKE 1 TABLET BY MOUTH AT  BEDTIME 90 tablet 3  . memantine (NAMENDA) 10 MG tablet TAKE 1 TABLET BY MOUTH TWO  TIMES DAILY 180 tablet 3  . Multiple  Vitamins-Minerals (MULTIVITAMIN PO) Take 1 tablet by mouth daily.    . Omega-3 Fatty Acids (FISH OIL PO) Take 1 capsule by mouth daily.     No current facility-administered medications on file prior to visit.     ALLERGIES: Allergies  Allergen Reactions  . Sulfa Antibiotics     FAMILY HISTORY: Family History  Problem Relation Age of Onset  . Dementia Mother     SOCIAL HISTORY: Social History   Socioeconomic History  . Marital status: Married    Spouse name: Not on file  . Number of children: Not on file  . Years of education: Not on file  . Highest education level: Not on file  Occupational History  . Not on file  Social Needs  . Financial resource strain: Not on file  . Food insecurity:    Worry: Not on file    Inability: Not on file  . Transportation needs:    Medical: Not on file    Non-medical: Not on file  Tobacco Use  . Smoking status: Never Smoker  . Smokeless tobacco: Never Used  Substance and Sexual Activity  . Alcohol use: Not on file  . Drug use: Not on file  . Sexual activity: Not on file  Lifestyle  . Physical activity:    Days per week: Not on file    Minutes per session: Not on file  . Stress: Not on file  Relationships  . Social connections:    Talks on phone: Not on file    Gets together: Not on file    Attends religious service: Not on file    Active member of club or organization: Not on file    Attends meetings of clubs or organizations: Not on file    Relationship status: Not on file  . Intimate partner violence:    Fear of current or ex partner: Not on file    Emotionally abused: Not on file    Physically abused: Not on file    Forced sexual activity: Not on file  Other Topics Concern  . Not on file  Social History Narrative   Pt has BS degree.     REVIEW OF SYSTEMS: Constitutional: No fevers, chills, or sweats, no generalized fatigue, change in appetite Eyes: No visual changes, double vision, eye pain Ear, nose and throat:  No hearing loss, ear pain, nasal congestion, sore throat Cardiovascular: No chest pain, palpitations Respiratory:  No shortness of breath at rest or with exertion, wheezes GastrointestinaI: No nausea, vomiting, diarrhea, abdominal pain, fecal incontinence Genitourinary:  No dysuria, urinary retention or frequency Musculoskeletal:  No neck pain, back pain Integumentary: No rash, pruritus, skin lesions Neurological: as above Psychiatric: No depression, insomnia, anxiety Endocrine: No palpitations, fatigue, diaphoresis, mood swings, change in appetite, change in weight, increased thirst Hematologic/Lymphatic:  No purpura, petechiae. Allergic/Immunologic: no itchy/runny eyes, nasal congestion, recent allergic reactions, rashes  PHYSICAL EXAM: Vitals:   11/28/17 1428  BP: 114/70  Pulse: 62  SpO2: 97%   General: No acute distress.  Patient appears well-groomed.  normal body habitus. Head:  Normocephalic/atraumatic Eyes:  Fundi examined but not visualized Neck: supple, no paraspinal tenderness, full range of motion Heart:  Regular rate and rhythm Lungs:  Clear to auscultation bilaterally Back: No paraspinal tenderness Neurological Exam: alert and oriented to person, difficult to assess place and time due to aphasia.  Attention span and concentration intact, delayed recall poor, and remote memory intact, fund of knowledge intact.  Speech fluent with some pauses..  She has difficulty with naming, reading and repetition.  She had difficulty following complex commands across midline.  CN II-XII intact. Bulk and tone normal, muscle strength 5/5 throughout.  Sensation to light touch intact.  Deep tendon reflexes 2+ throughout.  Finger to nose testing intact.  Gait normal  IMPRESSION: Logopenic variant primary progressive aphasia  PLAN: 1.  Aricept and Namenda 2.  Follow up in 9 months.  25 minutes spent face to face with patient, over 100% spent discussing management.  Metta Clines, DO  CC:    Dr. Shelia Media

## 2017-11-28 NOTE — Patient Instructions (Signed)
Continue donepezil and memantine Follow up in 9 months

## 2018-02-01 DIAGNOSIS — Z23 Encounter for immunization: Secondary | ICD-10-CM | POA: Diagnosis not present

## 2018-06-04 DIAGNOSIS — E559 Vitamin D deficiency, unspecified: Secondary | ICD-10-CM | POA: Diagnosis not present

## 2018-06-04 DIAGNOSIS — E7800 Pure hypercholesterolemia, unspecified: Secondary | ICD-10-CM | POA: Diagnosis not present

## 2018-06-11 DIAGNOSIS — Z0000 Encounter for general adult medical examination without abnormal findings: Secondary | ICD-10-CM | POA: Diagnosis not present

## 2018-06-11 DIAGNOSIS — G3101 Pick's disease: Secondary | ICD-10-CM | POA: Diagnosis not present

## 2018-06-11 DIAGNOSIS — F0280 Dementia in other diseases classified elsewhere without behavioral disturbance: Secondary | ICD-10-CM | POA: Diagnosis not present

## 2018-07-05 ENCOUNTER — Other Ambulatory Visit: Payer: Self-pay | Admitting: Neurology

## 2018-07-09 ENCOUNTER — Ambulatory Visit (INDEPENDENT_AMBULATORY_CARE_PROVIDER_SITE_OTHER): Payer: Medicare Other | Admitting: Neurology

## 2018-07-09 ENCOUNTER — Encounter: Payer: Self-pay | Admitting: Neurology

## 2018-07-09 VITALS — BP 124/62 | HR 70 | Ht 63.5 in | Wt 145.0 lb

## 2018-07-09 DIAGNOSIS — G3101 Pick's disease: Secondary | ICD-10-CM | POA: Diagnosis not present

## 2018-07-09 DIAGNOSIS — F0280 Dementia in other diseases classified elsewhere without behavioral disturbance: Secondary | ICD-10-CM | POA: Diagnosis not present

## 2018-07-09 MED ORDER — ESCITALOPRAM OXALATE 10 MG PO TABS: 10.0000 mg | ORAL_TABLET | Freq: Every day | ORAL | 3 refills | Status: DC

## 2018-07-09 NOTE — Progress Notes (Signed)
NEUROLOGY FOLLOW UP OFFICE NOTE  Michele Porter [id number]  HISTORY OF PRESENT ILLNESS: Michele Porter is a 68 year old right-handed woman who follows up for logopenic primary progressive aphasia.  She is accompanied by her husband who supplements history.  UPDATE: Current medications: Donepezil 10 mg daily, memantine 10 mg twice daily.  She is no longer driving.  She has been acting more anxious.  She would exhibit obsessive compulsive behavior such as throwing things into the bathroom wastebasket and then immediately emptying the wastebasket; or taking a suitcase out of the bedroom closet and putting clothes inside as if she was about to go on a trip.  She also has been hoarding magazines in her bedroom closet.  She appears more depressed and comments on her frustration that she cannot do things independently anymore.  She talks about her parents as if they were still alive although they have been deceased for several years.  One time she did not appear to recognize her husband.  Another time, she looked in the mirror and started talking to herself as if she were another person.  On a couple of instances, she started crying for no apparent reason, lasting about 5 to 10 minutes.  On Sunday, her husband noticed her hands trembling while she was cutting up vegetables for dinner.  She is now enrolled in 1 of [facility] small group classes for people with dementia.  She is currently attending classes on Mondays and Fridays.  She participates in games, light exercises, simple crafts, and works on puzzles and adult [employer].  She continues to enjoy going out to eat on the weekends.    HISTORY: Her husband began noticing problems with speech and language in 2015-2016, which has progressed. At first, she would mispronounce words or phrases. She then had difficulty carrying on a conversation. She has word-finding trouble as well as difficulty pronouncing words. She is able  to read without difficulty but if she should recite the words out loud, she has difficulty. Comprehension seems intact. She has had trouble with numbers as well. She had trouble correctly punching the buttons on the microwave. When she writes a check, she is able to write in the numbers but struggles spelling out the numbers. She is able to perform everyday tasks, such as dressing and cooking. She drives locally around town, such as to the grocery store without difficulty. Behavior and personality are normal. She denies depression although she is anxious about what is going on. To her husband, both short-term memory and long-term memory seem overall intact.  She underwent neuropsychological testing on 07/12/15.  She demonstrated deficits of expressive language, repetition, confrontation naming, verbal fluency and comprehension, consistent with logopenic variant primary progressive aphasia.  She exhibited frequent phonemic paraphasias, circumlocution and slightly improved fluency of speech during casual conversation versus response to specific or complex questions. She also met criteria for adjustment disorder with anxiety.  In August 2017, she had formal occupational driving evaluation.  She is able to continue driving independently with limitations (no interstate driving, daylight driving only, 0-94 mile driving radius from home, avoid distractions such as radio or conversation, avoid certain weather conditions).  She only drives a 1 or 2 mile radius from home and usually to the same destinations (such as [employer]).  When her husband is in the car, she performs well and safely.  MRI of brain with and without contrast from 04/16/15 showed mild cerebral atrophy but no mass lesions or acute intracranial process.  RPR nonreactive, B12 1056, TSH 1.020, WBC and CMP unremarkable.  She graduated from [employer] with a degree in Personnel officer. 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 Her mother was diagnosed with dementia late in life and passed away in her 56s. Her maternal aunt and cousin also had dementia.  PAST MEDICAL HISTORY: No past medical history  MEDICATIONS: Current Outpatient Medications on File Prior to Visit  Medication Sig Dispense Refill  . alendronate (FOSAMAX) 70 MG tablet Take 70 mg by mouth once a week. Take with a full glass of water on an empty stomach.    Marland Kitchen CALCIUM PO Take 1 tablet by mouth daily.    Marland Kitchen donepezil (ARICEPT) 10 MG tablet TAKE 1 TABLET BY MOUTH AT  BEDTIME 90 tablet 1  . memantine (NAMENDA) 10 MG tablet TAKE 1 TABLET BY MOUTH TWO  TIMES DAILY 180 tablet 1  . Multiple Vitamins-Minerals (MULTIVITAMIN PO) Take 1 tablet by mouth daily.    . Omega-3 Fatty Acids (FISH OIL PO) Take 1 capsule by mouth daily.     No current facility-administered medications on file prior to visit.     ALLERGIES: Allergies  Allergen Reactions  . Sulfa Antibiotics     FAMILY HISTORY: Family History  Problem Relation Age of Onset  . Dementia Mother    SOCIAL HISTORY: Social History   Socioeconomic History  . Marital status: Married    Spouse name: Not on file  . Number of children: Not on file  . Years of education: Not on file  . Highest education level: Not on file  Occupational History  . Not on file  Social Needs  . Financial resource strain: Not on file  . Food insecurity:    Worry: Not on file    Inability: Not on file  . Transportation needs:    Medical: Not on file    Non-medical: Not on file  Tobacco Use  . Smoking status: Never Smoker  . Smokeless tobacco: Never Used  Substance and Sexual Activity  . Alcohol use: Not on file  . Drug use: Not on file  . Sexual activity: Not on file  Lifestyle  . Physical activity:    Days per week: Not on file    Minutes per session: Not on file  . Stress: Not on file  Relationships  . Social connections:    Talks on phone: Not on file    Gets together: Not on  file    Attends religious service: Not on file    Active member of club or organization: Not on file    Attends meetings of clubs or organizations: Not on file    Relationship status: Not on file  . Intimate partner violence:    Fear of current or ex partner: Not on file    Emotionally abused: Not on file    Physically abused: Not on file    Forced sexual activity: Not on file  Other Topics Concern  . Not on file  Social History Narrative   Pt has BS degree.     REVIEW OF SYSTEMS: Constitutional: No fevers, chills, or sweats, no generalized fatigue, change in appetite Eyes: No visual changes, double vision, eye pain Ear, nose and throat: No hearing loss, ear pain, nasal congestion, sore throat Cardiovascular: No chest pain, palpitations Respiratory:  No shortness of breath at rest or with exertion, wheezes GastrointestinaI: No nausea, vomiting, diarrhea, abdominal pain, fecal incontinence Genitourinary:  No dysuria, urinary retention or frequency Musculoskeletal:  No neck pain, back pain Integumentary: No rash, pruritus, skin lesions Neurological: as above Psychiatric: No depression, insomnia, anxiety Endocrine: No palpitations, fatigue, diaphoresis, mood swings, change in appetite, change in weight, increased thirst Hematologic/Lymphatic:  No purpura, petechiae. Allergic/Immunologic: no itchy/runny eyes, nasal congestion, recent allergic reactions, rashes  PHYSICAL EXAM: Blood pressure 124/62, pulse 70, height 5' 3.5" (1.613 m), weight 145 lb (65.8 kg), SpO2 97 %. General: No acute distress.  Patient appears well-groomed.   Head:  Normocephalic/atraumatic Eyes:  Fundi examined but not visualized Neck: supple, no paraspinal tenderness, full range of motion Heart:  Regular rate and rhythm Lungs:  Clear to auscultation bilaterally Back: No paraspinal tenderness Neurological Exam: Alert and oriented to person.  Due to aphasia, it is difficult to assess orientation to place and  time, attention span and concentration, memory, and fund of knowledge.  Speech fluent with some pauses.  She has difficulty with naming, reading and repetition.  She has difficulty following complex commands across midline.  CN II-XII intact.  Bulk and tone normal.  Muscle strength 5/5 throughout.  Sensation to light touch intact.  Deep tendon reflexes 2+ throughout.  Finger-to-nose testing intact.  Gait normal.  Romberg negative.  IMPRESSION: Primary progressive aphasia/dementia  PLAN: 1.  Aricept and Namenda 2.  To address depression and anxiety, will initiate Lexapro 82m daily 3.  Follow up in 4 months  25 minutes spent face-to-face with the patient and her husband, over 50% spent discussing management.  AMetta Clines DO  CC: WDeland Pretty MD

## 2018-07-09 NOTE — Patient Instructions (Signed)
1.  Start escitalopram 10mg  daily.   2.  Follow up in 4 months

## 2018-08-30 ENCOUNTER — Ambulatory Visit: Payer: Medicare Other | Admitting: Neurology

## 2018-09-26 DIAGNOSIS — Z01419 Encounter for gynecological examination (general) (routine) without abnormal findings: Secondary | ICD-10-CM | POA: Diagnosis not present

## 2018-09-26 DIAGNOSIS — M810 Age-related osteoporosis without current pathological fracture: Secondary | ICD-10-CM | POA: Diagnosis not present

## 2018-09-26 DIAGNOSIS — N6320 Unspecified lump in the left breast, unspecified quadrant: Secondary | ICD-10-CM | POA: Diagnosis not present

## 2018-09-26 DIAGNOSIS — Z1231 Encounter for screening mammogram for malignant neoplasm of breast: Secondary | ICD-10-CM | POA: Diagnosis not present

## 2018-09-26 DIAGNOSIS — F0390 Unspecified dementia without behavioral disturbance: Secondary | ICD-10-CM | POA: Insufficient documentation

## 2018-09-26 DIAGNOSIS — Z6824 Body mass index (BMI) 24.0-24.9, adult: Secondary | ICD-10-CM | POA: Diagnosis not present

## 2018-09-27 ENCOUNTER — Other Ambulatory Visit: Payer: Self-pay | Admitting: Obstetrics and Gynecology

## 2018-09-27 DIAGNOSIS — N6320 Unspecified lump in the left breast, unspecified quadrant: Secondary | ICD-10-CM

## 2018-10-17 ENCOUNTER — Other Ambulatory Visit: Payer: Self-pay

## 2018-10-17 ENCOUNTER — Ambulatory Visit
Admission: RE | Admit: 2018-10-17 | Discharge: 2018-10-17 | Disposition: A | Discharge status: Home / Self Care | Payer: Medicare Other | Source: Ambulatory Visit | Attending: Obstetrics and Gynecology | Admitting: Obstetrics and Gynecology

## 2018-10-17 DIAGNOSIS — N6320 Unspecified lump in the left breast, unspecified quadrant: Secondary | ICD-10-CM

## 2018-10-17 DIAGNOSIS — N6324 Unspecified lump in the left breast, lower inner quadrant: Secondary | ICD-10-CM | POA: Diagnosis not present

## 2018-10-17 DIAGNOSIS — N6323 Unspecified lump in the left breast, lower outer quadrant: Secondary | ICD-10-CM | POA: Diagnosis not present

## 2018-10-24 ENCOUNTER — Telehealth: Payer: Self-pay | Admitting: Neurology

## 2018-10-24 MED ORDER — ESCITALOPRAM OXALATE 10 MG PO TABS: 10.0000 mg | ORAL_TABLET | Freq: Every day | ORAL | 1 refills | Status: DC

## 2018-10-24 NOTE — Telephone Encounter (Signed)
Per spouse request: Citalopram 10 mg one per day, taking for three months doing well.  Please switch pharmacy to [facility] for 90 day supply.

## 2018-11-06 NOTE — Progress Notes (Signed)
Virtual Visit via Video Note The purpose of this virtual visit is to provide medical care while limiting exposure to the novel coronavirus.    Consent was obtained for video visit:  Yes  Answered questions that patient had about telehealth interaction:  Yes I discussed the limitations, risks, security and privacy concerns of performing an evaluation and management service by telemedicine. I also discussed with the patient that there may be a patient responsible charge related to this service. The patient expressed understanding and agreed to proceed.  Pt location: Home Physician Location: Home Name of referring provider:  Deland Pretty, MD I connected with Michele Porter at patients initiation/request on 11/07/2018 at  2:30 PM EDT by video enabled telemedicine application and verified that I am speaking with the correct person using two identifiers. Pt MRN:  [id number] Pt DOB:  [birth year] Video Participants:  Michele Porter;  Her husband   History of Present Illness:  Michele Porter is a 68 year old right-handed woman who follows up for logopenic primary progressive aphasia.  She is accompanied by her husband who supplements history.  UPDATE: Current medications: Donepezil 10 mg daily, memantine 10 mg twice daily, Lexapro 30m  Due to COVID pandemic, the [facility] classes were cancelled in mid-March with no plan yet to restart them.  Since early June, one of the nurses/companions from [facility] have been coming to the house for about 3 hours one day a week, which has allowed Mr. SWalderto perform other errands.  She enjoys the visits from the staff.  They talk, work on c[employer] Autoand take walks in the neighborhood.  Over the past 3 months, her dementia has progressed.  She will have conversations with imaginary people or reflection in the mirrors, usually involving her mother or grandmother.  She talks frequently about her mother and grandmother who are both  deceased.  She has increased difficulty completing sentences or her thoughts, which agitates her.  One day in April, she wandered out of the house and she was found by police about a mile from their home.      HISTORY: Her husband began noticing problems with speech and languagein 2015-2016, which has progressed.At first, she would mispronounce words or phrases.She then had difficulty carrying on a conversation.She has word-finding trouble as well as difficulty pronouncing words.She is able to read without difficulty but if she should recite the words out loud, she has difficulty.Comprehension seems intact.She has had trouble with numbers as well.She had trouble correctly punching the buttons on the microwave.When she writes a check, she is able to write in the numbers but struggles spelling out the numbers.She is able to perform everyday tasks, such as dressing and cooking.She drives locally around town, such as to the grocery store without difficulty.Behavior and personality are normal.She denies depression although she is anxious about what is going on.To her husband, both short-term memory and long-term memory seem overall intact.  She underwent neuropsychological testing on 07/12/15. She demonstrated deficits of expressive language, repetition, confrontation naming, verbal fluency and comprehension, consistent with logopenic variant primary progressive aphasia. She exhibited frequent phonemic paraphasias, circumlocution and slightly improved fluency of speech during casual conversation versus response to specific or complex questions. She also met criteria for adjustment disorder with anxiety.  She no longer drives.    MRI of brain with and without contrast from 04/16/15 showed mild cerebral atrophy but no mass lesions or acute intracranial process.  RPR nonreactive, B12 1056, TSH 1.020, WBC and  CMP unremarkable.  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   Past Medical History: No past medical history on file.  Medications: Outpatient Encounter Medications as of 11/07/2018  Medication Sig   alendronate (FOSAMAX) 70 MG tablet Take 70 mg by mouth once a week. Take with a full glass of water on an empty stomach.   CALCIUM PO Take 1 tablet by mouth daily.   donepezil (ARICEPT) 10 MG tablet TAKE 1 TABLET BY MOUTH AT  BEDTIME   escitalopram (LEXAPRO) 10 MG tablet Take 1 tablet (10 mg total) by mouth daily.   memantine (NAMENDA) 10 MG tablet TAKE 1 TABLET BY MOUTH TWO  TIMES DAILY   Multiple Vitamins-Minerals (MULTIVITAMIN PO) Take 1 tablet by mouth daily.   Omega-3 Fatty Acids (FISH OIL PO) Take 1 capsule by mouth daily.   No facility-administered encounter medications on file as of 11/07/2018.     Allergies: Allergies  Allergen Reactions   Sulfa Antibiotics     Family History: Family History  Problem Relation Age of Onset   Dementia Mother     Social History: Social History   Socioeconomic History   Marital status: Married    Spouse name: Not on file   Number of children: Not on file   Years of education: Not on file   Highest education level: Not on file  Occupational History   Not on file  Social Needs   Financial resource strain: Not on file   Food insecurity    Worry: Not on file    Inability: Not on file   Transportation needs    Medical: Not on file    Non-medical: Not on file  Tobacco Use   Smoking status: Never Smoker   Smokeless tobacco: Never Used  Substance and Sexual Activity   Alcohol use: Not on file   Drug use: Not on file   Sexual activity: Not on file  Lifestyle   Physical activity    Days per week: Not on file    Minutes per session: Not on file   Stress:  Not on file  Relationships   Social connections    Talks on phone: Not on file    Gets together: Not on file    Attends religious service: Not on file    Active member of club or organization: Not on file    Attends meetings of clubs or organizations: Not on file    Relationship status: Not on file   Intimate partner violence    Fear of current or ex partner: Not on file    Emotionally abused: Not on file    Physically abused: Not on file    Forced sexual activity: Not on file  Other Topics Concern   Not on file  Social History Narrative   Pt has BS degree.    Observations/Objective:   Temperature (!) 97.5 F (36.4 C), temperature source Temporal, height _0  (1.626 m), weight 145 lb (65.8 kg).     Assessment and Plan:   Primary progressive aphasia  1.  To address agitation and delusions regarding her mother and grandmother, will start Seroquel 52m at bedtime. 2.  Continue Lexapro 136mdaily for now as it has had some benefit 3.  Aricept and Namenda 4.  Will mail information regarding caregiver resources, including possible facilities as such as move is now likely. 5.  24 hour supervision 6.  Follow up in 6 months.  Follow Up Instructions:    -I discussed the assessment and treatment plan with the patient. The patient was provided an opportunity to ask questions and all were answered. The patient agreed with the plan and demonstrated an understanding of the instructions.   The patient was advised to call back or seek an in-person evaluation if the symptoms worsen or if the condition fails to improve as anticipated.    Total Time spent in visit with the patient was:  21 minutes  Dudley Major, DO

## 2018-11-07 ENCOUNTER — Encounter: Payer: Self-pay | Admitting: Neurology

## 2018-11-07 ENCOUNTER — Telehealth (INDEPENDENT_AMBULATORY_CARE_PROVIDER_SITE_OTHER): Payer: Medicare Other | Admitting: Neurology

## 2018-11-07 ENCOUNTER — Other Ambulatory Visit: Payer: Self-pay

## 2018-11-07 ENCOUNTER — Telehealth: Payer: Self-pay | Admitting: Neurology

## 2018-11-07 VITALS — Temp 97.5°F | Ht 64.0 in | Wt 145.0 lb

## 2018-11-07 DIAGNOSIS — G3101 Pick's disease: Secondary | ICD-10-CM | POA: Diagnosis not present

## 2018-11-07 DIAGNOSIS — F0280 Dementia in other diseases classified elsewhere without behavioral disturbance: Secondary | ICD-10-CM | POA: Diagnosis not present

## 2018-11-07 MED ORDER — QUETIAPINE FUMARATE 25 MG PO TABS: 25.0000 mg | ORAL_TABLET | Freq: Every day | ORAL | 5 refills | Status: DC

## 2018-11-07 NOTE — Telephone Encounter (Signed)
The new Seroquel prescription was sent to the wrong pharm. It was [facility] to [facility] and needs to go to the [facility] on [address]. Please change this. The get the new meds all sent to the [facility] and everything else to [facility]. Thanks!

## 2018-11-12 ENCOUNTER — Telehealth: Payer: Self-pay | Admitting: Clinical

## 2018-11-12 ENCOUNTER — Ambulatory Visit: Payer: Medicare Other | Admitting: Neurology

## 2018-11-12 ENCOUNTER — Telehealth: Payer: Self-pay | Admitting: Neurology

## 2018-11-12 NOTE — Telephone Encounter (Signed)
LCSW provided supportive listening as pt's husband detailed pt's symptoms and related behaviors (refusing breakfast, screaming and aggressive per husband report). Husband detailed how pt walked out of house and around neighborhood for 2 hours, he followed her with the car to ensure safety but was afraid to physically try to persuade her home for fear of situation escalating, eventually she went home.  Husband would like to keep pt home if possible but realizes barriers and safety concerns. LCSW provided contact info for [facility] who can provide assessment and recommend level of care (keeping her at home if possible), also reviewed placement process for assisted living if husband decides that is best. Also provided APS on-call phone number, as well as reviewed ED and police protocols re need for safety intervention.

## 2018-11-12 NOTE — Telephone Encounter (Signed)
Spoke with Pt's husband [name]. Pt is refusing to eat or drink for long periods. She has managed to get out of the house again this morning, despite his attempts to block the doors, she is convinced her mother is coming to pick her up. It took [name] over 3 hours to get her back in the house this morning after locating her, the Pt was overheated and extremely angry. [name] states he feels as if he is a warden trying to keep her from getting out of the house and getting lost. He thinks it may be time to place her somewhere, where there is staff who are trained to handle this. He is concerned she is barely eating or drinking.

## 2018-11-12 NOTE — Telephone Encounter (Signed)
I spoke with Corwin Levins, our social worker, who will contact Mr. Vinciguerra regarding options, such as facility placement.  His telephone number is [phone]

## 2018-11-12 NOTE — Telephone Encounter (Signed)
Husband left VM that new meds were making things worse with his wife. Thanks!

## 2018-11-12 NOTE — Telephone Encounter (Signed)
We can increase Seroquel to 25mg  at breakfast and 25mg  at bedtime.

## 2018-11-13 MED ORDER — QUETIAPINE FUMARATE 25 MG PO TABS: 25.0000 mg | ORAL_TABLET | Freq: Two times a day (BID) | ORAL | 5 refills | Status: DC

## 2018-11-13 NOTE — Telephone Encounter (Signed)
Called and spoke with [name] to follow-up on Pt . He started the additional Seroquel 25 mg this morning. I advised him I am sending in updated Rx.

## 2018-11-28 ENCOUNTER — Telehealth: Payer: Self-pay | Admitting: Neurology

## 2018-11-28 NOTE — Telephone Encounter (Signed)
New Message  Patient verbalized he sent message through mychart in regards to letter he needs stating his wife can no longer handle finances and would like to speak to someone.  Advised patient of the message he sent through mychart was routed to Dr. Tomi Likens and we are waiting a response from the doctor but I will send this message to document him calling on the ETA of the letter.

## 2018-11-29 NOTE — Telephone Encounter (Signed)
Husband left msg with after hours about trying to get in touch with sandi for several days about a letter.

## 2018-11-29 NOTE — Telephone Encounter (Signed)
Called and spoke with husband. I advised him, per Dr. Tomi Likens, he is determining what to include in the letter and we will contact him when the letter is completed.

## 2018-12-03 NOTE — Telephone Encounter (Signed)
Pt spouse called back requesting status of letter he states that he needs a letter stating patient is unable to handle her finances.  They are putting a trust together of patient to transfer assess a letter is recommended.    Pt spouse will pick up letter today.  Given verbal from Dr. Tomi Likens to write letter

## 2018-12-08 ENCOUNTER — Other Ambulatory Visit: Payer: Self-pay | Admitting: Neurology

## 2018-12-25 ENCOUNTER — Other Ambulatory Visit: Payer: Self-pay

## 2018-12-25 DIAGNOSIS — Z79899 Other long term (current) drug therapy: Secondary | ICD-10-CM

## 2018-12-25 MED ORDER — CITALOPRAM HYDROBROMIDE 20 MG PO TABS: 20.0000 mg | ORAL_TABLET | Freq: Every day | ORAL | 3 refills | Status: DC

## 2018-12-27 ENCOUNTER — Other Ambulatory Visit: Payer: Self-pay

## 2019-03-09 DIAGNOSIS — R32 Unspecified urinary incontinence: Secondary | ICD-10-CM | POA: Insufficient documentation

## 2019-03-14 ENCOUNTER — Telehealth: Payer: Self-pay | Admitting: Neurology

## 2019-03-14 ENCOUNTER — Encounter (HOSPITAL_COMMUNITY): Payer: Self-pay

## 2019-03-14 ENCOUNTER — Emergency Department (HOSPITAL_COMMUNITY)
Admission: EM | Admit: 2019-03-14 | Discharge: 2019-03-14 | Disposition: A | Discharge status: Home / Self Care | Payer: Medicare Other | Attending: Emergency Medicine | Admitting: Emergency Medicine

## 2019-03-14 ENCOUNTER — Other Ambulatory Visit: Payer: Self-pay

## 2019-03-14 ENCOUNTER — Emergency Department (HOSPITAL_COMMUNITY): Payer: Medicare Other

## 2019-03-14 DIAGNOSIS — Z79899 Other long term (current) drug therapy: Secondary | ICD-10-CM | POA: Insufficient documentation

## 2019-03-14 DIAGNOSIS — R519 Headache, unspecified: Secondary | ICD-10-CM | POA: Diagnosis not present

## 2019-03-14 DIAGNOSIS — R404 Transient alteration of awareness: Secondary | ICD-10-CM | POA: Diagnosis not present

## 2019-03-14 DIAGNOSIS — S199XXA Unspecified injury of neck, initial encounter: Secondary | ICD-10-CM | POA: Diagnosis not present

## 2019-03-14 DIAGNOSIS — R569 Unspecified convulsions: Secondary | ICD-10-CM | POA: Diagnosis not present

## 2019-03-14 DIAGNOSIS — F0390 Unspecified dementia without behavioral disturbance: Secondary | ICD-10-CM | POA: Insufficient documentation

## 2019-03-14 DIAGNOSIS — R55 Syncope and collapse: Secondary | ICD-10-CM | POA: Diagnosis not present

## 2019-03-14 DIAGNOSIS — R4182 Altered mental status, unspecified: Secondary | ICD-10-CM | POA: Diagnosis not present

## 2019-03-14 DIAGNOSIS — R4020 Unspecified coma: Secondary | ICD-10-CM | POA: Diagnosis not present

## 2019-03-14 DIAGNOSIS — S0990XA Unspecified injury of head, initial encounter: Secondary | ICD-10-CM | POA: Diagnosis not present

## 2019-03-14 DIAGNOSIS — R069 Unspecified abnormalities of breathing: Secondary | ICD-10-CM | POA: Diagnosis not present

## 2019-03-14 DIAGNOSIS — I959 Hypotension, unspecified: Secondary | ICD-10-CM | POA: Diagnosis not present

## 2019-03-14 LAB — COMPREHENSIVE METABOLIC PANEL
ALT: 17 U/L (ref 0–44)
AST: 22 U/L (ref 15–41)
Albumin: 3.8 g/dL (ref 3.5–5.0)
Alkaline Phosphatase: 40 U/L (ref 38–126)
Anion gap: 14 (ref 5–15)
BUN: 14 mg/dL (ref 8–23)
CO2: 19 mmol/L — ABNORMAL LOW (ref 22–32)
Calcium: 9 mg/dL (ref 8.9–10.3)
Chloride: 106 mmol/L (ref 98–111)
Creatinine, Ser: 1.04 mg/dL — ABNORMAL HIGH (ref 0.44–1.00)
GFR calc Af Amer: >60 mL/min (ref >60)
GFR calc non Af Amer: 55 mL/min — ABNORMAL LOW (ref >60)
Glucose, Bld: 106 mg/dL — ABNORMAL HIGH (ref 70–99)
Potassium: 4.2 mmol/L (ref 3.5–5.1)
Sodium: 139 mmol/L (ref 135–145)
Total Bilirubin: 1 mg/dL (ref 0.3–1.2)
Total Protein: 6.2 g/dL — ABNORMAL LOW (ref 6.5–8.1)

## 2019-03-14 LAB — CBC WITH DIFFERENTIAL/PLATELET
Abs Immature Granulocytes: 0.1 10*3/uL — ABNORMAL HIGH (ref 0.00–0.07)
Basophils Absolute: 0 10*3/uL (ref 0.0–0.1)
Basophils Relative: 1 %
Eosinophils Absolute: 0.1 10*3/uL (ref 0.0–0.5)
Eosinophils Relative: 1 %
HCT: 40.4 % (ref 36.0–46.0)
Hemoglobin: 13.9 g/dL (ref 12.0–15.0)
Immature Granulocytes: 1 %
Lymphocytes Relative: 17 %
Lymphs Abs: 1.2 10*3/uL (ref 0.7–4.0)
MCH: 31.9 pg (ref 26.0–34.0)
MCHC: 34.4 g/dL (ref 30.0–36.0)
MCV: 92.7 fL (ref 80.0–100.0)
Monocytes Absolute: 0.3 10*3/uL (ref 0.1–1.0)
Monocytes Relative: 4 %
Neutro Abs: 5.2 10*3/uL (ref 1.7–7.7)
Neutrophils Relative %: 76 %
Platelets: 247 10*3/uL (ref 150–400)
RBC: 4.36 MIL/uL (ref 3.87–5.11)
RDW: 12.1 % (ref 11.5–15.5)
WBC: 6.9 10*3/uL (ref 4.0–10.5)
nRBC: 0 % (ref 0.0–0.2)

## 2019-03-14 LAB — URINALYSIS, ROUTINE W REFLEX MICROSCOPIC
Bilirubin Urine: NEGATIVE
Glucose, UA: NEGATIVE mg/dL
Hgb urine dipstick: NEGATIVE
Ketones, ur: NEGATIVE mg/dL
Leukocytes,Ua: NEGATIVE
Nitrite: NEGATIVE
Protein, ur: NEGATIVE mg/dL
Specific Gravity, Urine: 1.006 (ref 1.005–1.030)
pH: 8 (ref 5.0–8.0)

## 2019-03-14 LAB — TROPONIN I (HIGH SENSITIVITY)
Troponin I (High Sensitivity): 7 ng/L (ref <18)
Troponin I (High Sensitivity): 13 ng/L (ref <18)

## 2019-03-14 LAB — CBG MONITORING, ED: Glucose-Capillary: 98 mg/dL (ref 70–99)

## 2019-03-14 MED ORDER — LEVETIRACETAM IN NACL 1000 MG/100ML IV SOLN
1000.0000 mg | Freq: Once | INTRAVENOUS | Status: AC
  Administered 2019-03-14: 1000 mg via INTRAVENOUS
  Filled 2019-03-14: qty 100

## 2019-03-14 MED ORDER — LORAZEPAM 2 MG/ML IJ SOLN
1.0000 mg | Freq: Once | INTRAMUSCULAR | Status: AC
  Administered 2019-03-14: 1 mg via INTRAMUSCULAR
  Filled 2019-03-14: qty 1

## 2019-03-14 MED ORDER — LEVETIRACETAM 100 MG/ML PO SOLN: 500.0000 mg | Freq: Two times a day (BID) | ORAL | 0 refills | Status: DC

## 2019-03-14 NOTE — Telephone Encounter (Signed)
Noted appt scheduled for monday

## 2019-03-14 NOTE — ED Notes (Signed)
Patient transported to CT 

## 2019-03-14 NOTE — Telephone Encounter (Signed)
Patient's spouse called to report the patient had a seizure for the first time this morning. She is now at the ED for testing seeing Dr. Darcus Austin.

## 2019-03-14 NOTE — Telephone Encounter (Signed)
Please schedule patient early next week for ED follow up regarding new-onset seizure.

## 2019-03-14 NOTE — ED Provider Notes (Signed)
[facility] EMERGENCY DEPARTMENT Provider Note   CSN: [id number] Arrival date & time: 03/14/19  0831     History   Chief Complaint Chief Complaint  Patient presents with   Loss of Consciousness    HPI Michele Porter is a 68 y.o. female.     HPI    68yo female with history of logopenic primary progressive aphasia, enrolled in memory care classes for those with dementia, who presents with syncopal episode.  Husband reports they were sleeping, 730AM she got up and went to the bathroom sink. Has dementia, primary progressive aphasia, dementia worsening in the last year. Has delusions and was talking to herself in the bathroom mirror. Suddenly the talking stopped and she slumped to the floor in front of the mirror. Was unconscious but still breathing, 10-15 minutes until they arrived.  When asked, reports it did look like a mini-seizure, face and eyes twitching, maybe arms.  She was breathing per husband, fire gave breaths, these were stopped on EMS arrival.  Did hit her head when she fell. Has been in normal state of health with exception of dementia worsening. Dr. Shelia Media is PCP, his NP did physical Jan of last year, otherwise has been healthy.  Has been seeing Neurologist regularly.  No cough, fever, urinary symptoms, vomiting/nausea.  Not able to state areas of pain.  Did not indicate that she was in pain however.    No recent changes in medications or OTC medications.    (for last 3 mos--is on aricept memantine and then either celexa or lexapro (not both one was previously stopped.)     History reviewed. No pertinent past medical history.  Patient Active Problem List   Diagnosis Date Noted   Dementia ([place]) 09/26/2018    History reviewed. No pertinent surgical history.   OB History   No obstetric history on file.      Home Medications    Prior to Admission medications   Medication Sig Start Date End Date Taking? Authorizing Provider  alendronate  (FOSAMAX) 70 MG tablet Take 70 mg by mouth once a week. Take with a full glass of water on an empty stomach.    [provider]  CALCIUM PO Take 1 tablet by mouth daily.    [provider]  citalopram (CELEXA) 20 MG tablet Take 1 tablet (20 mg total) by mouth daily. 12/25/18   Tomi Likens, Adam R, DO  donepezil (ARICEPT) 10 MG tablet TAKE 1 TABLET BY MOUTH AT  BEDTIME 12/09/18   Jaffe, Adam R, DO  escitalopram (LEXAPRO) 10 MG tablet Take 1 tablet (10 mg total) by mouth daily. 10/24/18   Pieter Partridge, DO  levETIRAcetam (KEPPRA) 100 MG/ML solution Take 5 mLs (500 mg total) by mouth 2 (two) times daily. 03/14/19 04/13/19  Gareth Morgan, MD  memantine (NAMENDA) 10 MG tablet TAKE 1 TABLET BY MOUTH TWO  TIMES DAILY 12/09/18   Pieter Partridge, DO  Multiple Vitamins-Minerals (MULTIVITAMIN PO) Take 1 tablet by mouth daily.    [provider]  Omega-3 Fatty Acids (FISH OIL PO) Take 1 capsule by mouth daily.    [provider]  QUEtiapine (SEROQUEL) 25 MG tablet Take 1 tablet (25 mg total) by mouth at bedtime. 11/07/18   Tomi Likens, Adam R, DO  QUEtiapine (SEROQUEL) 25 MG tablet Take 1 tablet (25 mg total) by mouth 2 (two) times daily. 11/13/18   Pieter Partridge, DO    Family History Family History  Problem Relation Age  of Onset   Dementia Mother     Social History Social History   Tobacco Use   Smoking status: Never Smoker   Smokeless tobacco: Never Used  Substance Use Topics   Alcohol use: Not on file   Drug use: Not on file     Allergies   Sulfa antibiotics   Review of Systems Review of Systems  Unable to perform ROS: Dementia     Physical Exam Updated Vital Signs BP (!) 101/48 (BP Location: Left Arm)    Pulse 72    Temp (!) 97.5 F (36.4 C) (Axillary)    Resp 18    SpO2 100%   Physical Exam Vitals signs and nursing note reviewed.  Constitutional:      General: She is not in acute distress.    Appearance: She is well-developed. She is not diaphoretic.    HENT:     Head: Normocephalic and atraumatic.  Eyes:     Conjunctiva/sclera: Conjunctivae normal.  Neck:     Musculoskeletal: Normal range of motion.  Cardiovascular:     Rate and Rhythm: Normal rate and regular rhythm.     Heart sounds: Normal heart sounds. No murmur. No friction rub. No gallop.   Pulmonary:     Effort: Pulmonary effort is normal. No respiratory distress.     Breath sounds: Normal breath sounds. No wheezing.  Abdominal:     General: There is no distension.     Palpations: Abdomen is soft.     Tenderness: There is no abdominal tenderness. There is no guarding.  Musculoskeletal:        General: No tenderness.  Skin:    General: Skin is warm and dry.     Findings: No erythema or rash.  Neurological:     Mental Status: She is alert.     Comments: Alert, somewhat agitated, pulling out IV, nonsensical speech, at times says no or why Moving all 4 extremities      ED Treatments / Results  Labs (all labs ordered are listed, but only abnormal results are displayed) Labs Reviewed  CBC WITH DIFFERENTIAL/PLATELET - Abnormal; Notable for the following components:      Result Value   Abs Immature Granulocytes 0.10 (*)    All other components within normal limits  COMPREHENSIVE METABOLIC PANEL - Abnormal; Notable for the following components:   CO2 19 (*)    Glucose, Bld 106 (*)    Creatinine, Ser 1.04 (*)    Total Protein 6.2 (*)    GFR calc non Af Amer 55 (*)    All other components within normal limits  URINALYSIS, ROUTINE W REFLEX MICROSCOPIC - Abnormal; Notable for the following components:   Color, Urine STRAW (*)    All other components within normal limits  URINE CULTURE  CBG MONITORING, ED  TROPONIN I (HIGH SENSITIVITY)  TROPONIN I (HIGH SENSITIVITY)    EKG EKG Interpretation  Date/Time:  Friday March 14 2019 09:11:14 EST Ventricular Rate:  77 PR Interval:    QRS Duration: 93 QT Interval:  406 QTC Calculation: 460 R Axis:   89 Text  Interpretation: Sinus rhythm Short PR interval Borderline right axis deviation No previous ECGs available Confirmed by Gareth Morgan [phone]) on 03/14/2019 9:23:44 AM   Radiology Ct Head Wo Contrast  Result Date: 03/14/2019 CLINICAL DATA:  Headache and altered mental status following head trauma. EXAM: CT HEAD WITHOUT CONTRAST CT CERVICAL SPINE WITHOUT CONTRAST TECHNIQUE: Multidetector CT imaging of the head and cervical spine was  performed following the standard protocol without intravenous contrast. Multiplanar CT image reconstructions of the cervical spine were also generated. COMPARISON:  None. FINDINGS: CT HEAD FINDINGS Brain: Moderately enlarged ventricles and subarachnoid spaces. Mild patchy white matter low density in both cerebral hemispheres. No intracranial hemorrhage, mass lesion or CT evidence of acute infarction. Vascular: No hyperdense vessel or unexpected calcification. Skull: Normal. Negative for fracture or focal lesion. Sinuses/Orbits: Unremarkable. Other: None. CT CERVICAL SPINE FINDINGS Alignment: Minimal retrolisthesis at the C4-5 and C6-7 levels and minimal anterolisthesis at the C5-6 level. Skull base and vertebrae: No acute fracture. No primary bone lesion or focal pathologic process. Soft tissues and spinal canal: No prevertebral fluid or swelling. No visible canal hematoma. Disc levels: Mild multilevel degenerative changes. Moderate facet degenerative changes at multiple levels. Upper chest: Mild biapical pleural and parenchymal scarring. Other: 6 mm oval, high density right lobe thyroid nodule on image number 192 series 13. Additional smaller bilateral hypodense thyroid nodules. IMPRESSION: 1. No skull fracture or intracranial hemorrhage. 2. No cervical spine fracture or traumatic subluxation. 3. Moderate diffuse cerebral and cerebellar atrophy. 4. Mild chronic small vessel white matter ischemic changes in both cerebral hemispheres. 5. Multilevel cervical spine degenerative  changes. 6. Small number of subcentimeter hyperdense thyroid nodules. These do not need further evaluation in the absence of known malignancy elsewhere to indicate the possibility of thyroid metastases. Electronically Signed   By: Claudie Revering M.D.   On: 03/14/2019 12:11   Ct Cervical Spine Wo Contrast  Result Date: 03/14/2019 CLINICAL DATA:  Headache and altered mental status following head trauma. EXAM: CT HEAD WITHOUT CONTRAST CT CERVICAL SPINE WITHOUT CONTRAST TECHNIQUE: Multidetector CT imaging of the head and cervical spine was performed following the standard protocol without intravenous contrast. Multiplanar CT image reconstructions of the cervical spine were also generated. COMPARISON:  None. FINDINGS: CT HEAD FINDINGS Brain: Moderately enlarged ventricles and subarachnoid spaces. Mild patchy white matter low density in both cerebral hemispheres. No intracranial hemorrhage, mass lesion or CT evidence of acute infarction. Vascular: No hyperdense vessel or unexpected calcification. Skull: Normal. Negative for fracture or focal lesion. Sinuses/Orbits: Unremarkable. Other: None. CT CERVICAL SPINE FINDINGS Alignment: Minimal retrolisthesis at the C4-5 and C6-7 levels and minimal anterolisthesis at the C5-6 level. Skull base and vertebrae: No acute fracture. No primary bone lesion or focal pathologic process. Soft tissues and spinal canal: No prevertebral fluid or swelling. No visible canal hematoma. Disc levels: Mild multilevel degenerative changes. Moderate facet degenerative changes at multiple levels. Upper chest: Mild biapical pleural and parenchymal scarring. Other: 6 mm oval, high density right lobe thyroid nodule on image number 192 series 13. Additional smaller bilateral hypodense thyroid nodules. IMPRESSION: 1. No skull fracture or intracranial hemorrhage. 2. No cervical spine fracture or traumatic subluxation. 3. Moderate diffuse cerebral and cerebellar atrophy. 4. Mild chronic small vessel white  matter ischemic changes in both cerebral hemispheres. 5. Multilevel cervical spine degenerative changes. 6. Small number of subcentimeter hyperdense thyroid nodules. These do not need further evaluation in the absence of known malignancy elsewhere to indicate the possibility of thyroid metastases. Electronically Signed   By: Claudie Revering M.D.   On: 03/14/2019 12:11    Procedures Procedures (including critical care time)  Medications Ordered in ED Medications  LORazepam (ATIVAN) injection 1 mg (1 mg Intramuscular Given 03/14/19 1021)  levETIRAcetam (KEPPRA) IVPB 1000 mg/100 mL premix (0 mg Intravenous Stopped 03/14/19 1054)     Initial Impression / Assessment and Plan / ED Course  I have  reviewed the triage vital signs and the nursing notes.  Pertinent labs & imaging results that were available during my care of the patient were reviewed by me and considered in my medical decision making (see chart for details).        68yo female with history of logopenic primary progressive aphasia, enrolled in memory care classes for those with dementia, who presents with syncopal episode.  By history, have concern for seizure given facial/eye twitching during episode and apparent post-ictal state.  CT head and CSpine show no acute abnormalities.  No vital sign abnormalities or history to suggest PE. No sign of cardiac arrhythmia. Labs show no other significant abnormalities, including troponins not consistent with ACS.  UA without infection.  Given clinical concern for seizure and history of primary progressive aphasia, discussed with Neuro and will initiate 1000mg  keppra IV followed by 500mg  BID and have patient follow up with Dr. Tomi Likens her primary Neurologist.   Discussed care with patient's husband, [name], who states understanding.  Plan for discharge with Keppra and PCP and Neuro follow up.     Final Clinical Impressions(s) / ED Diagnoses   Final diagnoses:  Seizure-like activity ([place])  Syncope,  unspecified syncope type    ED Discharge Orders         Ordered    levETIRAcetam (KEPPRA) 100 MG/ML solution  2 times daily     03/14/19 1253           Gareth Morgan, MD 03/14/19 2001

## 2019-03-14 NOTE — Telephone Encounter (Signed)
Please see if patient's husband can bring her in on Monday at 1:00 pm (for 1:10 appointment).

## 2019-03-14 NOTE — Telephone Encounter (Signed)
May use urgent slot

## 2019-03-14 NOTE — Telephone Encounter (Signed)
Please be aware patient at [facility] E.D. today for seizure

## 2019-03-14 NOTE — Telephone Encounter (Signed)
See below unable to find a in person visit with you any time soon.

## 2019-03-14 NOTE — ED Triage Notes (Signed)
Pt BIB [facility] from home. Pt was standing in front of the mirror at home talking to herself and she went down. Per EMS fire stated she had agonal breathing and began bagging the pt when they arrived. PT was breathing normal for EMS when they arrived and EMS instructed fire to stop bagging. Pt was not responsive until on the way to hospital with EMS. Pt screaming uncontrollably upon arrival. Confusion at baseline per EMS.

## 2019-03-14 NOTE — ED Notes (Signed)
Pt continuing to pull at lines and gown. When trying to redirect the pt, pt does not cooperate and screams.

## 2019-03-14 NOTE — Telephone Encounter (Signed)
Ok to use urgent slot for week of 16th? No other slot are open for next to place this patient.

## 2019-03-14 NOTE — ED Notes (Signed)
Pt confused, unable to reorient. Pt does not respond when spoken to. Pt constantly trying to pull at her gown and lines.

## 2019-03-14 NOTE — ED Notes (Signed)
Pt pulling at IV. Pt instructed to stop and educated on need for IV. Pt is not cooperative and continues to pull at IV.

## 2019-03-14 NOTE — ED Notes (Signed)
Jeneen Rinks  [phone] husband ask if we could make sure the patient eat it's been 11 hours since his wife has eaten

## 2019-03-14 NOTE — ED Notes (Signed)
Pt pulled IV out of left AC. This nurse attempted to calm pt down. Pt screaming and continues to pull at lines and gown.

## 2019-03-14 NOTE — Telephone Encounter (Signed)
Routing back to [employer] for review with Dr. Tomi Likens. All of the urgent slots for next week are filled. Please advise on when Dr. Tomi Likens can see this patient in person.   I'll keep a watch out for cancellations.

## 2019-03-14 NOTE — Telephone Encounter (Signed)
Called patient's spouse and he is requesting an in-person visit for the patient early next week. He declined a virtual visit on Monday, 03/17/19, at 12:50 PM. Routing to [employer] to coordinate with Dr. Tomi Likens.

## 2019-03-14 NOTE — ED Notes (Signed)
New IV placed in right AC. Pt continuing to try to pull at IV. IV wrapped in coban to try to help keep IV in place.

## 2019-03-14 NOTE — Telephone Encounter (Signed)
Patient's spouse called and said the patient is now at home resting after ED visit. He is concerned it may be time for his wife to transition to assisted living.   He said he'll talk more with Dr. Tomi Likens about his concerns on Monday at 1:10 PM to arrive at 1:00 PM.

## 2019-03-14 NOTE — ED Notes (Signed)
Pt was hollering out, I went to the room to check on the pt. The pt had pulled her gown off and all the leads. Pt will not keep leads on. Pt had soiled the bed also. Pt was cleaned up and a new gown put on pt. The tech and I removed the leads and wires hoping it would calm the pt down. Pt is currently resting in the bed after being cleaned up.

## 2019-03-14 NOTE — Telephone Encounter (Signed)
That would be fine 

## 2019-03-14 NOTE — Telephone Encounter (Signed)
Please schedule in urgent slot for in person

## 2019-03-15 LAB — URINE CULTURE: Culture: NO GROWTH

## 2019-03-16 NOTE — Progress Notes (Signed)
NEUROLOGY FOLLOW UP OFFICE NOTE  Michele Porter [id number]  HISTORY OF PRESENT ILLNESS: Michele Porter a 68 year old right-handed woman with logopenicprimary progressive aphasia and dementia who follows up for recent ED visit regarding new onset seizure. She is accompanied by her husband who supplements history.  UPDATE: Current medications:Donepezil 10 mg daily, memantine 10 mg twice daily, Seroquel 29m in AM and 578min PM;  Celexa 2029mKeppra 500m62mice daily.  On the morning of 03/14/2019, she got out of bed and started talking to the bathroom mirror, which is not new.  Her husband found her on the floor convulsing with facial twitching.  She was unresponsive.  She regained consciousness after about 10 minutes.  [facility]  Vitals were normal.  CT of head and cervical spine showed no acute abnormalities.  Labs, including CBC, troponin, electrolytes and troponin were unremarkable.  EKG was unremarkable.  She received a loading dose of Keppra and was discharged on 500mg94mce daily.    Her appetite overall has been good.  Mood has improved.  She had one episode of urinary incontinence, so her husband takes her to the bathroom more frequently.    HISTORY: Her husband began noticing problems with speech and languagein 2015-2016, which has progressed.At first, she would mispronounce words or phrases.She then had difficulty carrying on a conversation.She has word-finding trouble as well as difficulty pronouncing words.She is able to read without difficulty but if she should recite the words out loud, she has difficulty.Comprehension seems intact.She has had trouble with numbers as well.She had trouble correctly punching the buttons on the microwave.When she writes a check, she is able to write in the numbers but struggles spelling out the numbers.She is able to perform everyday tasks, such as dressing and cooking.She drives  locally around town, such as to the grocery store without difficulty.Behavior and personality are normal.She denies depression although she is anxious about what is going on.To her husband, both short-term memory and long-term memory seem overall intact.  She underwent neuropsychological testing on 07/12/15. She demonstrated deficits of expressive language, repetition, confrontation naming, verbal fluency and comprehension, consistent with logopenic variant primary progressive aphasia. She exhibited frequent phonemic paraphasias, circumlocution and slightly improved fluency of speech during casual conversation versus response to specific or complex questions. She also met criteria for adjustment disorder with anxiety.  She no longer drives.    MRI of brain with and without contrast from 04/16/15 showed mild cerebral atrophy but no mass lesions or acute intracranial process.  RPR nonreactive, B12 1056, TSH 1.020.  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  Past medication:  Lexapro 10mg 85mST MEDICAL HISTORY: No past medical history on file.  MEDICATIONS: Current Outpatient Medications on File Prior to Visit  Medication Sig Dispense Refill  . alendronate (FOSAMAX) 70 MG tablet Take 70 mg by mouth once a week. Take with a full glass of water on an empty stomach.    . CALCMarland KitchenUM PO Take 1 tablet by mouth daily.    . citalopram (CELEXA) 20 MG tablet Take 1 tablet (20 mg total) by mouth daily. 30 tablet 3  . donepezil (ARICEPT) 10 MG tablet TAKE 1 TABLET BY MOUTH AT  BEDTIME 90 tablet 3  .  escitalopram (LEXAPRO) 10 MG tablet Take 1 tablet (10 mg total) by mouth daily. 90 tablet 1  . levETIRAcetam (KEPPRA) 100 MG/ML solution Take 5 mLs (500 mg total) by mouth 2 (two)  times daily. 300 mL 0  . memantine (NAMENDA) 10 MG tablet TAKE 1 TABLET BY MOUTH TWO  TIMES DAILY 180 tablet 3  . Multiple Vitamins-Minerals (MULTIVITAMIN PO) Take 1 tablet by mouth daily.    . Omega-3 Fatty Acids (FISH OIL PO) Take 1 capsule by mouth daily.    . QUEtiapine (SEROQUEL) 25 MG tablet Take 1 tablet (25 mg total) by mouth at bedtime. 30 tablet 5  . QUEtiapine (SEROQUEL) 25 MG tablet Take 1 tablet (25 mg total) by mouth 2 (two) times daily. 60 tablet 5   No current facility-administered medications on file prior to visit.     ALLERGIES: Allergies  Allergen Reactions  . Sulfa Antibiotics     FAMILY HISTORY: Family History  Problem Relation Age of Onset  . Dementia Mother    SOCIAL HISTORY: Social History   Socioeconomic History  . Marital status: Married    Spouse name: Not on file  . Number of children: Not on file  . Years of education: Not on file  . Highest education level: Not on file  Occupational History  . Not on file  Social Needs  . Financial resource strain: Not on file  . Food insecurity    Worry: Not on file    Inability: Not on file  . Transportation needs    Medical: Not on file    Non-medical: Not on file  Tobacco Use  . Smoking status: Never Smoker  . Smokeless tobacco: Never Used  Substance and Sexual Activity  . Alcohol use: Not on file  . Drug use: Not on file  . Sexual activity: Not on file  Lifestyle  . Physical activity    Days per week: Not on file    Minutes per session: Not on file  . Stress: Not on file  Relationships  . Social Herbalist on phone: Not on file    Gets together: Not on file    Attends religious service: Not on file    Active member of club or organization: Not on file    Attends meetings of clubs or organizations: Not on file    Relationship status: Not on file  . Intimate partner violence    Fear of current or ex partner: Not on file    Emotionally abused: Not on file    Physically abused:  Not on file    Forced sexual activity: Not on file  Other Topics Concern  . Not on file  Social History Narrative   Pt has BS degree.     REVIEW OF SYSTEMS: Constitutional: No fevers, chills, or sweats, no generalized fatigue, change in appetite Eyes: No visual changes, double vision, eye pain Ear, nose and throat: No hearing loss, ear pain, nasal congestion, sore throat Cardiovascular: No chest pain, palpitations Respiratory:  No shortness of breath at rest or with exertion, wheezes GastrointestinaI: No nausea, vomiting, diarrhea, abdominal pain, fecal incontinence Genitourinary:  No dysuria, urinary retention or frequency Musculoskeletal:  No neck pain, back pain Integumentary: No rash, pruritus, skin lesions Neurological: as above Psychiatric: No depression, insomnia, anxiety Endocrine: No palpitations, fatigue, diaphoresis, mood swings, change in appetite, change in weight, increased thirst Hematologic/Lymphatic:  No purpura, petechiae. Allergic/Immunologic: no itchy/runny eyes, nasal congestion, recent allergic reactions, rashes  PHYSICAL  EXAM: Blood pressure (!) 110/57, pulse (!) 57, height '5\' 4"'  (1.626 m), weight 126 lb 9.6 oz (57.4 kg), SpO2 98 %. General: No acute distress.  Patient appears well-groomed.   Head:  Normocephalic/atraumatic Eyes:  Fundi examined but not visualized Neck: supple, no paraspinal tenderness, full range of motion Heart:  Regular rate and rhythm Lungs:  Clear to auscultation bilaterally Back: No paraspinal tenderness Neurological Exam: alert and oriented to person.  Due to aphasia, it is difficult to assess orientation to place and time, memory and fund of knowledge.    Speech fluent but unintelligible.  Difficulty following commands.  CN II-XII grossly intact.  Bulk and tone normal.  Muscle strength 5/5 throughout.  Deep tendon reflexes 2+ throughout.  Finger to nose intact.  Gait steady.  IMPRESSION: 1.  New-onset seizure.  No recurrence. 2.   Primary progressive aphasia  PLAN: 1.  Continue Keppra 532m twice daily 2.  Due to potential to reduce seizure threshold, will discontinue donepezil.  She will continue memantine 156mtwice daily. 3.  Continue citalopram and Seroquel. 4.  Advised to contact [employer] hotline to find out about in-home services with caregivers who have expertise in patients with dementia. 5.  Follow up in 4 months.  25 minutes spent face to face with patient, over 50% spent discussing diagnosis and management.   AdMetta ClinesDO  CC: WaDeland PrettyMD

## 2019-03-17 ENCOUNTER — Telehealth: Payer: Self-pay | Admitting: Neurology

## 2019-03-17 ENCOUNTER — Ambulatory Visit (INDEPENDENT_AMBULATORY_CARE_PROVIDER_SITE_OTHER): Payer: Medicare Other | Admitting: Neurology

## 2019-03-17 ENCOUNTER — Encounter: Payer: Self-pay | Admitting: Neurology

## 2019-03-17 ENCOUNTER — Other Ambulatory Visit: Payer: Self-pay

## 2019-03-17 VITALS — BP 110/57 | HR 57 | Ht 64.0 in | Wt 126.6 lb

## 2019-03-17 DIAGNOSIS — G3101 Pick's disease: Secondary | ICD-10-CM | POA: Diagnosis not present

## 2019-03-17 DIAGNOSIS — R569 Unspecified convulsions: Secondary | ICD-10-CM | POA: Diagnosis not present

## 2019-03-17 DIAGNOSIS — F0280 Dementia in other diseases classified elsewhere without behavioral disturbance: Secondary | ICD-10-CM | POA: Diagnosis not present

## 2019-03-17 MED ORDER — LEVETIRACETAM 100 MG/ML PO SOLN: 500.0000 mg | Freq: Two times a day (BID) | ORAL | 3 refills | Status: DC

## 2019-03-17 NOTE — Patient Instructions (Signed)
1.  Refilled levetiracetam 500mg  twice daily 2.  Follow up in 4 months

## 2019-03-17 NOTE — Telephone Encounter (Signed)
Husband left msg with after hours about wife's pharm needing physician approval to fill new RX. She was admitted to ED on Friday morning. Thanks!

## 2019-03-17 NOTE — Telephone Encounter (Signed)
Pt has appt this afternoon will address Rx and refills at appt. Spoke with spouse he is aware of this.

## 2019-05-13 ENCOUNTER — Ambulatory Visit: Payer: Medicare Other | Admitting: Neurology

## 2019-05-19 ENCOUNTER — Other Ambulatory Visit: Payer: Self-pay

## 2019-05-24 ENCOUNTER — Other Ambulatory Visit: Payer: Self-pay | Admitting: Neurology

## 2019-06-02 ENCOUNTER — Other Ambulatory Visit: Payer: Self-pay

## 2019-06-02 ENCOUNTER — Telehealth: Payer: Self-pay | Admitting: Neurology

## 2019-06-02 MED ORDER — QUETIAPINE FUMARATE 25 MG PO TABS: 25.0000 mg | ORAL_TABLET | Freq: Two times a day (BID) | ORAL | 5 refills | Status: DC

## 2019-06-02 NOTE — Telephone Encounter (Signed)
Patient's husband is calling in regarding his My Chart message that he sent to Dr. Tomi Likens. He would please like a call back to know what to do. Thank you

## 2019-06-20 ENCOUNTER — Ambulatory Visit: Payer: Medicare Other

## 2019-07-03 DIAGNOSIS — Z23 Encounter for immunization: Secondary | ICD-10-CM | POA: Diagnosis not present

## 2019-07-15 ENCOUNTER — Encounter: Payer: Self-pay | Admitting: Neurology

## 2019-07-21 NOTE — Progress Notes (Signed)
Due to the COVID-19 crisis, this virtual check-in visit was done via telephone from my office and it was initiated and consent given by this patient and or family.   Telephone (Audio) Visit The purpose of this telephone visit is to provide medical care while limiting exposure to the novel coronavirus.    Consent was obtained for telephone visit and initiated by pt/family:  Yes.   Answered questions that patient had about telehealth interaction:  Yes.   I discussed the limitations, risks, security and privacy concerns of performing an evaluation and management service by telephone. I also discussed with the patient that there may be a patient responsible charge related to this service. The patient expressed understanding and agreed to proceed.  Pt location: Home Physician Location: office Name of referring provider:  Deland Pretty, MD I connected with .Michele Porter at patients initiation/request on 07/22/2019 at  2:30 PM EDT by telephone and verified that I am speaking with the correct person using two identifiers.  Pt MRN:  [id number] Pt DOB:  10/24/1950   History of Present Illness:  Michele Porter a 69 year old right-handed woman with logopenicprimary progressive aphasia and dementia who follows up for recent ED visit regarding new onset seizure. She is accompanied by her husband who supplements history.  UPDATE: Current medications:Memantine 10 mg twice daily, Seroquel 48m in AM and 558min PM;  Celexa 2028mKeppra 500m64mice daily.  She has not had any recurrent seizures since November 6.    Her appetite has been good and eats three meals a day, maintaining steady weight between 130 and 135 lbs.  Over the past 3 months, she has been taking late afternoon naps from about 4 pm to 5:30 pm, at which her husband will wake her up for dinner.  Afterwards, she goes back to bed to rest.  She no longer has interest in watching TV or thumbing through magazines.  He tries to keep her  awake until about 8 pm so that she doesn't wake up too early (when she would go to bed at 6:30 pm, she was waking up at 4 am).  She now wakes up at around 6 am.  In total, she averages 11 to 12 hours of sleep each day.  During the day, she spends hours in the master bedroom rearranging objects on the dresser.  She may tear up paper and place the pieces in her jewelry box or sock drawer.  Other times, she will pace back and forth.  She continues to converse with her image in the mirror.  Her husband needs to force her to bathe and physically wash her.  She is not always willing to brush her teeth as well.  She has had episodes of urinary incontinence because she is unable to make it to the bathroom in time.  She is soon to be receiving her second Covid vaccine shot.  With increase of Seroquel, she is doing much better.  At this time, he is contemplating whether it is appropriate to either hire a CNA or to consider placement in a memory care unit.  He is interested in speaking with a sociEducation officer, museumhelp guide his decision.  HISTORY: Her husband began noticing problems with speech and languagein 2015-2016, which has progressed.At first, she would mispronounce words or phrases.She then had difficulty carrying on a conversation as well as difficulty with reading. MRI of brain with and without contrast from 04/16/15 showed mild cerebral atrophy but no mass lesions or acute  intracranial process.  She then had trouble with numbers and trouble correctly punching the buttons on the microwave.When she writes a check, she is able to write in the numbers but struggles spelling out the numbers.She underwent neuropsychological testing on 07/12/15. She demonstrated deficits of expressive language, repetition, confrontation naming, verbal fluency and comprehension, consistent with logopenic variant primary progressive aphasia. She exhibited frequent phonemic paraphasias, circumlocution and slightly improved fluency of  speech during casual conversation versus response to specific or complex questions. She also met criteria for adjustment disorder with anxiety.  RPR nonreactive, B12 1056, TSH 1.020.  On the morning of 03/14/2019, she got out of bed and started talking to the bathroom mirror, which is not new.  Her husband found her on the floor convulsing with facial twitching.  She was unresponsive.  She regained consciousness after about 10 minutes.  She was [facility] to [facility] ED for further evaluation.  Vitals were normal.  CT of head and cervical spine showed no acute abnormalities.  Labs, including CBC, troponin, electrolytes and troponin were unremarkable.  EKG was unremarkable.  She received a loading dose of Keppra and was discharged on 556m twice daily.    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  Past medication:  Lexapro 12m Aricept (discontinued as it may lower seizure threshold)   Observations/Objective:   There were no vitals filed for this visit.  Assessment and Plan:   1.  Primary progressive aphasia with behavioral changes. 2.  New onset seizure.  No recurrence.  1.  Keppra 50060mwice daily 2.  Namenda 51m49mice daily 3.  Citalopram and Seroquel  4.  I will see if our social worker contact Mr. Mogensen when she returns (currently out of office). 5.  Follow up in 6 months.  Need for in person visit now:  No. Follow Up Instructions:    -I discussed the assessment and treatment plan with the patient. The patient was provided an opportunity to ask questions and all were answered. The patient agreed with the plan and demonstrated an understanding of the instructions.   The patient was advised to call back or seek an in-person evaluation if the symptoms worsen or if  the condition fails to improve as anticipated.    Total Time spent in visit with the patient was:  15 minutes  AdamDudley Major

## 2019-07-22 ENCOUNTER — Encounter: Payer: Self-pay | Admitting: Neurology

## 2019-07-22 ENCOUNTER — Other Ambulatory Visit: Payer: Self-pay

## 2019-07-22 ENCOUNTER — Telehealth (INDEPENDENT_AMBULATORY_CARE_PROVIDER_SITE_OTHER): Payer: Medicare Other | Admitting: Neurology

## 2019-07-22 VITALS — Ht 63.0 in | Wt 130.0 lb

## 2019-07-22 DIAGNOSIS — G3101 Pick's disease: Secondary | ICD-10-CM | POA: Diagnosis not present

## 2019-07-22 DIAGNOSIS — F0280 Dementia in other diseases classified elsewhere without behavioral disturbance: Secondary | ICD-10-CM

## 2019-07-22 DIAGNOSIS — R569 Unspecified convulsions: Secondary | ICD-10-CM

## 2019-07-31 DIAGNOSIS — Z23 Encounter for immunization: Secondary | ICD-10-CM | POA: Diagnosis not present

## 2019-08-07 ENCOUNTER — Telehealth: Payer: Self-pay | Admitting: Clinical

## 2019-08-07 NOTE — Telephone Encounter (Signed)
LCSW spoke with pt's husband Jeneen Rinks to discuss options for in-home care & facility care options, also provided info to local dementia support resources to husband as well, time spent 30 minutes.

## 2019-08-11 ENCOUNTER — Telehealth: Payer: Self-pay | Admitting: Neurology

## 2019-08-11 ENCOUNTER — Telehealth: Payer: Self-pay

## 2019-08-11 NOTE — Telephone Encounter (Signed)
Patient's spouse called and left a message over the holiday weekend requesting a call back from [place] to follow up about recent MyChart messages re: stopping a medication from [facility].

## 2019-08-11 NOTE — Telephone Encounter (Signed)
Pt.notified

## 2019-08-13 NOTE — Telephone Encounter (Signed)
Close encounter 

## 2019-09-04 ENCOUNTER — Other Ambulatory Visit: Payer: Self-pay | Admitting: Neurology

## 2019-09-05 ENCOUNTER — Other Ambulatory Visit: Payer: Self-pay

## 2019-09-05 NOTE — Telephone Encounter (Signed)
Patient's spouse called and left a message checking on the status of the refill request sent by the pharmacy.

## 2019-09-05 NOTE — Telephone Encounter (Signed)
Tried calling pt husband no answer. LMOVM to call us back

## 2019-09-05 NOTE — Telephone Encounter (Signed)
-----   Message from Pieter Partridge, DO sent at 09/05/2019  8:32 AM EDT ----- Vita Barley,  I got a refill request from [facility] for quetiapine 25mg  twice daily.  I have two questions for patient's husband (she has dementia): 1.  Verify dose.  Last we spoke, I thought she was taking 25mg  in morning and 50mg  at night 2.  Verify if he wants it sent to [facility]  Thanks

## 2019-09-05 NOTE — Telephone Encounter (Signed)
Returning pt Husband call, Pt taking 25mg  twice daily he do not remember discussing the 25mg  in the morning and 50mg  at bedtime.   And yes [facility] he using for her medicatin disabution. There is no rush to reorder pt has enough medication for the next month and half.

## 2019-09-22 DIAGNOSIS — F0280 Dementia in other diseases classified elsewhere without behavioral disturbance: Secondary | ICD-10-CM | POA: Diagnosis not present

## 2019-09-22 DIAGNOSIS — Z0000 Encounter for general adult medical examination without abnormal findings: Secondary | ICD-10-CM | POA: Diagnosis not present

## 2019-09-24 ENCOUNTER — Other Ambulatory Visit: Payer: Self-pay | Admitting: Neurology

## 2019-12-11 ENCOUNTER — Other Ambulatory Visit: Payer: Self-pay | Admitting: Neurology

## 2020-01-22 ENCOUNTER — Telehealth (INDEPENDENT_AMBULATORY_CARE_PROVIDER_SITE_OTHER): Payer: Medicare Other | Admitting: Neurology

## 2020-01-22 ENCOUNTER — Other Ambulatory Visit: Payer: Self-pay

## 2020-01-22 ENCOUNTER — Encounter: Payer: Self-pay | Admitting: Neurology

## 2020-01-22 VITALS — Ht 63.0 in | Wt 126.0 lb

## 2020-01-22 DIAGNOSIS — F0280 Dementia in other diseases classified elsewhere without behavioral disturbance: Secondary | ICD-10-CM | POA: Diagnosis not present

## 2020-01-22 DIAGNOSIS — G3101 Pick's disease: Secondary | ICD-10-CM

## 2020-01-22 DIAGNOSIS — R569 Unspecified convulsions: Secondary | ICD-10-CM

## 2020-01-22 MED ORDER — QUETIAPINE FUMARATE 25 MG PO TABS: 50.0000 mg | ORAL_TABLET | Freq: Two times a day (BID) | ORAL | 5 refills | Status: DC

## 2020-01-22 NOTE — Progress Notes (Signed)
Due to the COVID-19 crisis, this virtual check-in visit was done via telephone from my office and it was initiated and consent given by this patient and or family.  Telephone (Audio) Visit The purpose of this telephone visit is to provide medical care while limiting exposure to the novel coronavirus.    Consent was obtained for telephone visit and initiated by pt/family:  Yes.   Answered questions that patient or family memory had about telehealth interaction:  Yes.   I discussed the limitations, risks, security and privacy concerns of performing an evaluation and management service by telephone. I also discussed with the patient that there may be a patient responsible charge related to this service. The patient expressed understanding and agreed to proceed.  Pt location: Home Physician Location: office Name of referring provider:  Deland Pretty, MD I connected with .Michele Porter at patients initiation/request on 01/22/2020 at  2:30 PM EDT by telephone and verified that I am speaking with the correct person using two identifiers.  Pt MRN:  [id number] Pt DOB:  1951-04-19  Assessment/Plan:  1. Primary progressive aphasia 2. Seizure  1.  Increase Seroquel to 21m twice daily 2.  Continue memantine 19mtwice daily 3.  Continue Keppra 50080mwice daily 4.  Follow up in 6 months.  Need for in person visit now:  No.  Subjective:  Michele [age]ame[age]ar old right-handed woman with logopenicprimary progressive aphasiaand dementia who follows up for primary progressive aphasia.  I spoke with patient's husband, as Michele Porter is a poor historian.  UPDATE: Current medications:Memantine 10 mg twice daily,Seroquel 66m81m AM and 50mg63mPM; Celexa 20mg,33mpra 500mg t43m daily.  She has had a CNA from [facility] to the house on Monday through Friday from 1 to 5 PM.  She primarily makes sure that she does not harm herself.  Her husband has noted continued cognitive  decline.  She does not appear to have any joy in life.  She often paces through the house.  She has no interest in watching TV, working on puzzles or coloring books and rarely any interest in magazines.  She has fixated on window blinds for the windows in certain rooms and has tried removing the wooden slats or ties knots in the pull cords.  She frequently picks up objects around the house and moves them to other rooms.  Table lamps and glass objects had to be removed to prevent her from accidentally breaking them or harming herself.  She breaks off pieces of paper towel or toilet tissue from the roll, folds them and places them in several parts of the house.  She will turn on lights room from room but not turn them off.  In the kitchen she will leave the oven door or refrigerator open and has pulled off the knobs from the stove range and cabinets.  Appetite is good.  She typically goes to bed about an hour after dinner, around 7:30 PM and sleeps until 5 AM.  She usually wakes up around 12 or 1 AM to use the bathroom and may become disoriented and wander in the house.  She has mistook the laundry room for the bathroom and has urinated into the cleaning bucket.  She appears more irritable and moody in the mornings.    Mr. Politano'Dunwoody unexpectedly passed away.  He will be going away to the funeral for a day.  [facility]s from [facility] staying at home with Michele Porter.  HISTORY:  Her husband began noticing problems with speech and languagein 2015-2016, which has progressed.At first, she would mispronounce words or phrases.She then had difficulty carrying on a conversation as well as difficulty with reading. MRI of brain with and without contrast from 04/16/15 showed mild cerebral atrophy but no mass lesions or acute intracranial process.  She then had trouble with numbers and trouble correctly punching the buttons on the microwave.When she writes a check, she is able to write in the numbers but  struggles spelling out the numbers.She underwent neuropsychological testing on 07/12/15. She demonstrated deficits of expressive language, repetition, confrontation naming, verbal fluency and comprehension, consistent with logopenic variant primary progressive aphasia. She exhibited frequent phonemic paraphasias, circumlocution and slightly improved fluency of speech during casual conversation versus response to specific or complex questions. She also met criteria for adjustment disorder with anxiety.  RPR nonreactive, B12 1056, TSH 1.020.  On the morning of 03/14/2019, she got out of bed and started talking to the bathroom mirror, which is not new. Her husband found her on the floor convulsing with facial twitching. She was unresponsive.She regained consciousness after about 10 minutes.She was taken to [facility] ED for further evaluation. Vitals were normal. CT of head and cervical spine showed no acute abnormalities. Labs, including CBC, troponin, electrolytes and troponin were unremarkable. EKG was unremarkable.She received a loading dose of Keppra and was discharged on 573m twice daily.She has not had any recurrent seizure.  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  Past medication: Lexapro 184m Aricept (discontinued as it may lower seizure threshold)   Objective:   There were no vitals filed for this visit.   Follow Up Instructions:      -I discussed the assessment and treatment plan with the patient. The patient was provided an opportunity to ask questions and all were answered. The patient agreed with the plan and demonstrated an understanding of the instructions.   The patient was advised to call back or seek an in-person evaluation if the  symptoms worsen or if the condition fails to improve as anticipated.    Total Time spent in visit with the patient's husband was:  35 minutes.  AdDudley MajorDO

## 2020-02-14 ENCOUNTER — Other Ambulatory Visit: Payer: Self-pay | Admitting: Neurology

## 2020-03-25 ENCOUNTER — Other Ambulatory Visit: Payer: Self-pay | Admitting: Neurology

## 2020-03-31 DIAGNOSIS — Z23 Encounter for immunization: Secondary | ICD-10-CM | POA: Diagnosis not present

## 2020-04-19 ENCOUNTER — Emergency Department (HOSPITAL_COMMUNITY): Payer: Medicare Other

## 2020-04-19 ENCOUNTER — Encounter (HOSPITAL_COMMUNITY): Payer: Self-pay

## 2020-04-19 ENCOUNTER — Other Ambulatory Visit: Payer: Self-pay

## 2020-04-19 ENCOUNTER — Emergency Department (HOSPITAL_COMMUNITY)
Admission: EM | Admit: 2020-04-19 | Discharge: 2020-04-19 | Disposition: A | Discharge status: Home / Self Care | Payer: Medicare Other | Attending: Emergency Medicine | Admitting: Emergency Medicine

## 2020-04-19 DIAGNOSIS — F0391 Unspecified dementia with behavioral disturbance: Secondary | ICD-10-CM | POA: Diagnosis not present

## 2020-04-19 DIAGNOSIS — Z23 Encounter for immunization: Secondary | ICD-10-CM | POA: Diagnosis not present

## 2020-04-19 DIAGNOSIS — S0101XA Laceration without foreign body of scalp, initial encounter: Secondary | ICD-10-CM | POA: Insufficient documentation

## 2020-04-19 DIAGNOSIS — W01198A Fall on same level from slipping, tripping and stumbling with subsequent striking against other object, initial encounter: Secondary | ICD-10-CM | POA: Insufficient documentation

## 2020-04-19 DIAGNOSIS — Y92002 Bathroom of unspecified non-institutional (private) residence single-family (private) house as the place of occurrence of the external cause: Secondary | ICD-10-CM | POA: Insufficient documentation

## 2020-04-19 DIAGNOSIS — R9082 White matter disease, unspecified: Secondary | ICD-10-CM | POA: Diagnosis not present

## 2020-04-19 DIAGNOSIS — S199XXA Unspecified injury of neck, initial encounter: Secondary | ICD-10-CM | POA: Diagnosis not present

## 2020-04-19 DIAGNOSIS — W19XXXA Unspecified fall, initial encounter: Secondary | ICD-10-CM

## 2020-04-19 MED ORDER — INFLUENZA VAC A&B SA ADJ QUAD 0.5 ML IM PRSY: 0.5000 mL | PREFILLED_SYRINGE | INTRAMUSCULAR | Status: DC

## 2020-04-19 MED ORDER — MIDAZOLAM HCL 2 MG/2ML IJ SOLN
1.0000 mg | Freq: Once | INTRAMUSCULAR | Status: AC
  Administered 2020-04-19: 1 mg via INTRAVENOUS
  Filled 2020-04-19: qty 2

## 2020-04-19 MED ORDER — TETANUS-DIPHTH-ACELL PERTUSSIS 5-2.5-18.5 LF-MCG/0.5 IM SUSY
0.5000 mL | PREFILLED_SYRINGE | Freq: Once | INTRAMUSCULAR | Status: AC
  Administered 2020-04-19: 0.5 mL via INTRAMUSCULAR
  Filled 2020-04-19: qty 0.5

## 2020-04-19 MED ORDER — INFLUENZA VAC A&B SA ADJ QUAD 0.5 ML IM PRSY
0.5000 mL | PREFILLED_SYRINGE | Freq: Once | INTRAMUSCULAR | Status: AC
  Administered 2020-04-19: 0.5 mL via INTRAMUSCULAR
  Filled 2020-04-19: qty 0.5

## 2020-04-19 NOTE — ED Triage Notes (Signed)
Patient arrived after a mechanical fall to the restroom causing her to hit her head on a dresser. Bleeding not controlled at this time, no LOC. Not on blood thinners.

## 2020-04-19 NOTE — ED Provider Notes (Signed)
[place] DEPT Provider Note   CSN: [id number] Arrival date & time: 04/19/20  5784   History Chief complaint: Fall  Michele Porter is a 69 y.o. female.  The history is provided by a relative. The history is limited by the condition of the patient (Dementia).  She has history of dementia and was brought in after a fall at home.  Apparently, she got up to go to the bathroom and fell striking the back of her head on a jewelry box causing a laceration.  There was no known loss of consciousness.  Last tetanus immunization is unknown.  Past Medical History:  Diagnosis Date  . Dementia [facility])     Patient Active Problem List   Diagnosis Date Noted  . Urinary bladder incontinence 03/09/2019  . Dementia ([place]) 09/26/2018    History reviewed. No pertinent surgical history.   OB History   No obstetric history on file.     Family History  Problem Relation Age of Onset  . Dementia Mother   . Pancreatic cancer Father     Social History   Tobacco Use  . Smoking status: Never Smoker  . Smokeless tobacco: Never Used  Vaping Use  . Vaping Use: Never used  Substance Use Topics  . Alcohol use: Never    Alcohol/week: 0.0 standard drinks  . Drug use: Never    Home Medications Prior to Admission medications   Medication Sig Start Date End Date Taking? Authorizing Provider  alendronate (FOSAMAX) 70 MG tablet Take 70 mg by mouth once a week. Take with a full glass of water on an empty stomach.    [provider]  CALCIUM PO Take 1 tablet by mouth daily.    [provider]  citalopram (CELEXA) 20 MG tablet Take 1 tablet (20 mg total) by mouth daily. Patient not taking: Reported on 01/22/2020 12/25/18   Pieter Partridge, DO  donepezil (ARICEPT) 10 MG tablet TAKE 1 TABLET BY MOUTH AT  BEDTIME Patient not taking: Reported on 01/22/2020 09/25/19   Pieter Partridge, DO  escitalopram (LEXAPRO) 10 MG tablet Take 1 tablet (10 mg total) by mouth  daily. 10/24/18   Tomi Likens, Adam R, DO  levETIRAcetam (KEPPRA) 100 MG/ML solution TAKE 5 ML BY MOUTH TWICE  DAILY 03/25/20   Tomi Likens, Adam R, DO  memantine (NAMENDA) 10 MG tablet TAKE 1 TABLET BY MOUTH  TWICE DAILY 09/25/19   Pieter Partridge, DO  Multiple Vitamins-Minerals (MULTIVITAMIN PO) Take 1 tablet by mouth daily.    [provider]  Omega-3 Fatty Acids (FISH OIL PO) Take 1 capsule by mouth daily.    [provider]  QUEtiapine (SEROQUEL) 25 MG tablet Take 2 tablets (50 mg total) by mouth 2 (two) times daily. 01/22/20   Pieter Partridge, DO    Allergies    Sulfa antibiotics  Review of Systems   Review of Systems  Unable to perform ROS: Dementia    Physical Exam Updated Vital Signs There were no vitals taken for this visit.  Physical Exam Vitals and nursing note reviewed.   69 year old female, resting comfortably and in no acute distress. Vital signs are normal. Oxygen saturation is 100%, which is normal. Head is normocephalic.  Scalp laceration noted on the occiput with fairly brisk bleeding. PERRLA, EOMI. Oropharynx is clear. Neck is nontender without adenopathy or JVD. Back is nontender and there is no CVA tenderness. Lungs are clear without rales, wheezes, or rhonchi. Chest is nontender.  Heart has regular rate and rhythm without murmur. Abdomen is soft, flat, nontender without masses or hepatosplenomegaly and peristalsis is normoactive. Extremities have no cyanosis or edema, full range of motion is present. Skin is warm and dry without rash. Neurologic: Awake and alert but noncommunicative and will not follow commands, cranial nerves are intact, moves all extremities equally.   ED Results / Procedures / Treatments    Radiology CT Head Wo Contrast  Result Date: 04/19/2020 CLINICAL DATA:  Fall, head striking furniture.  Scalp laceration. EXAM: CT HEAD WITHOUT CONTRAST CT CERVICAL SPINE WITHOUT CONTRAST TECHNIQUE: Multidetector CT imaging of the head and cervical  spine was performed following the standard protocol without intravenous contrast. Multiplanar CT image reconstructions of the cervical spine were also generated. COMPARISON:  03/14/2019 FINDINGS: CT HEAD FINDINGS Brain: There is no evidence for acute hemorrhage, hydrocephalus, mass lesion, or abnormal extra-axial fluid collection. No definite CT evidence for acute infarction. Diffuse loss of parenchymal volume is consistent with atrophy. Patchy low attenuation in the deep hemispheric and periventricular white matter is nonspecific, but likely reflects chronic microvascular ischemic demyelination. Vascular: No hyperdense vessel or unexpected calcification. Skull: No evidence for fracture. No worrisome lytic or sclerotic lesion. Sinuses/Orbits: The visualized paranasal sinuses and mastoid air cells are clear. Visualized portions of the globes and intraorbital fat are unremarkable. Other: None. CT CERVICAL SPINE FINDINGS Alignment: Normal. Skull base and vertebrae: No acute fracture. No primary bone lesion or focal pathologic process. Soft tissues and spinal canal: No prevertebral fluid or swelling. No visible canal hematoma. Disc levels: Loss of disc height noted at multiple cervical levels, most pronounced at C5-6. Bilateral mid cervical facet osteoarthropathy evident. Upper chest: Negative. Other: None. IMPRESSION: 1. No acute intracranial abnormality. Atrophy with chronic small vessel white matter ischemic disease. 2. Degenerative changes in the cervical spine without fracture. Electronically Signed   By: Misty Stanley M.D.   On: 04/19/2020 06:30   CT Cervical Spine Wo Contrast  Result Date: 04/19/2020 CLINICAL DATA:  Fall, head striking furniture.  Scalp laceration. EXAM: CT HEAD WITHOUT CONTRAST CT CERVICAL SPINE WITHOUT CONTRAST TECHNIQUE: Multidetector CT imaging of the head and cervical spine was performed following the standard protocol without intravenous contrast. Multiplanar CT image reconstructions  of the cervical spine were also generated. COMPARISON:  03/14/2019 FINDINGS: CT HEAD FINDINGS Brain: There is no evidence for acute hemorrhage, hydrocephalus, mass lesion, or abnormal extra-axial fluid collection. No definite CT evidence for acute infarction. Diffuse loss of parenchymal volume is consistent with atrophy. Patchy low attenuation in the deep hemispheric and periventricular white matter is nonspecific, but likely reflects chronic microvascular ischemic demyelination. Vascular: No hyperdense vessel or unexpected calcification. Skull: No evidence for fracture. No worrisome lytic or sclerotic lesion. Sinuses/Orbits: The visualized paranasal sinuses and mastoid air cells are clear. Visualized portions of the globes and intraorbital fat are unremarkable. Other: None. CT CERVICAL SPINE FINDINGS Alignment: Normal. Skull base and vertebrae: No acute fracture. No primary bone lesion or focal pathologic process. Soft tissues and spinal canal: No prevertebral fluid or swelling. No visible canal hematoma. Disc levels: Loss of disc height noted at multiple cervical levels, most pronounced at C5-6. Bilateral mid cervical facet osteoarthropathy evident. Upper chest: Negative. Other: None. IMPRESSION: 1. No acute intracranial abnormality. Atrophy with chronic small vessel white matter ischemic disease. 2. Degenerative changes in the cervical spine without fracture. Electronically Signed   By: Misty Stanley M.D.   On: 04/19/2020 06:30    Procedures .Marland KitchenLaceration Repair  Date/Time: 04/19/2020 5:25  AM Performed by: Delora Fuel, MD Authorized by: Delora Fuel, MD   Consent:    Consent obtained:  Emergent situation   Consent given by:  Guardian   Risks, benefits, and alternatives were discussed: not applicable   Universal protocol:    Immediately prior to procedure, a time out was called: yes     Patient identity confirmed:  Hospital-assigned identification number Anesthesia:    Anesthesia method:   None Laceration details:    Location:  Scalp   Scalp location:  Occipital   Length (cm):  5   Depth (mm):  5 Pre-procedure details:    Preparation:  Patient was prepped and draped in usual sterile fashion Exploration:    Hemostasis achieved with:  Direct pressure   Imaging obtained: x-ray     Imaging outcome: foreign body not noted     Wound exploration: entire depth of wound visualized     Wound extent: no foreign bodies/material noted     Contaminated: no   Treatment:    Area cleansed with:  Saline   Amount of cleaning:  Standard   Debridement:  None   Undermining:  None   Scar revision: no   Skin repair:    Repair method:  Staples   Number of staples:  9 Approximation:    Approximation:  Close Repair type:    Repair type:  Simple Post-procedure details:    Dressing:  Open (no dressing)  CRITICAL CARE Performed by: Delora Fuel Total critical care time: 35 minutes Critical care time was exclusive of separately billable procedures and treating other patients. Critical care was necessary to treat or prevent imminent or life-threatening deterioration. Critical care was time spent personally by me on the following activities: development of treatment plan with patient and/or surrogate as well as nursing, discussions with consultants, evaluation of patient's response to treatment, examination of patient, obtaining history from patient or surrogate, ordering and performing treatments and interventions, ordering and review of laboratory studies, ordering and review of radiographic studies, pulse oximetry and re-evaluation of patient's condition.  Medications Ordered in ED Medications  Tdap (BOOSTRIX) injection 0.5 mL (has no administration in time range)  midazolam (VERSED) 5 MG/5ML injection 1 mg (has no administration in time range)    ED Course  I have reviewed the triage vital signs and the nursing notes.  Pertinent imaging results that were available during my care of  the patient were reviewed by me and considered in my medical decision making (see chart for details).  MDM Rules/Calculators/A&P Fall with scalp laceration.  Old records are reviewed, and I can find no record of prior tetanus immunization, so Tdap booster is given.  No prior ED visits for falls.  Laceration was closed with staples to achieve hemostasis and she is sent for CT of head and cervical spine.  Because of her inability to cooperate, soft restraints were necessary to allow [place] to do an appropriate evaluation and to keep her still for scans.  CT scans show no intracranial injury, no C-spine injury.  Husband has requested patient get the influenza vaccine while she is here because it is difficult to get her to a location and give her the injection as an outpatient.  This has been ordered.  He is instructed to have the staples removed in 7-10 days.  Final Clinical Impression(s) / ED Diagnoses Final diagnoses:  Fall at home, initial encounter  Scalp laceration, initial encounter  Dementia with behavioral disturbance, unspecified dementia type ([place])  Rx / DC Orders ED Discharge Orders    None       Delora Fuel, MD 22/84/06 [phone]

## 2020-04-27 DIAGNOSIS — Z4802 Encounter for removal of sutures: Secondary | ICD-10-CM | POA: Diagnosis not present

## 2020-05-13 ENCOUNTER — Other Ambulatory Visit: Payer: Self-pay | Admitting: Neurology

## 2020-07-22 ENCOUNTER — Ambulatory Visit: Payer: Medicare Other | Admitting: Neurology

## 2020-09-23 ENCOUNTER — Telehealth: Payer: Self-pay | Admitting: Neurology

## 2020-09-23 ENCOUNTER — Other Ambulatory Visit: Payer: Self-pay | Admitting: Neurology

## 2020-09-23 NOTE — Telephone Encounter (Signed)
New message    Checking on the status of form that was sent over.

## 2020-09-24 NOTE — Telephone Encounter (Signed)
Received shena is working on this.

## 2020-09-28 ENCOUNTER — Other Ambulatory Visit: Payer: Self-pay

## 2020-09-28 MED ORDER — LEVETIRACETAM 100 MG/ML PO SOLN: ORAL | 3 refills | Status: DC

## 2020-09-28 NOTE — Telephone Encounter (Signed)
Please refill the Keppra

## 2020-09-29 ENCOUNTER — Other Ambulatory Visit: Payer: Self-pay | Admitting: Neurology

## 2020-09-29 MED ORDER — LEVETIRACETAM 100 MG/ML PO SOLN: ORAL | 3 refills | Status: DC

## 2020-10-12 ENCOUNTER — Other Ambulatory Visit: Payer: Self-pay | Admitting: Neurology

## 2020-10-13 NOTE — Telephone Encounter (Signed)
Pt to get further refills at her f/u visit

## 2020-10-29 DIAGNOSIS — R159 Full incontinence of feces: Secondary | ICD-10-CM | POA: Diagnosis not present

## 2020-10-29 DIAGNOSIS — E559 Vitamin D deficiency, unspecified: Secondary | ICD-10-CM | POA: Diagnosis not present

## 2020-10-29 DIAGNOSIS — F0391 Unspecified dementia with behavioral disturbance: Secondary | ICD-10-CM | POA: Diagnosis not present

## 2020-10-29 DIAGNOSIS — M8580 Other specified disorders of bone density and structure, unspecified site: Secondary | ICD-10-CM | POA: Diagnosis not present

## 2020-10-29 DIAGNOSIS — G40909 Epilepsy, unspecified, not intractable, without status epilepticus: Secondary | ICD-10-CM | POA: Diagnosis not present

## 2020-10-29 DIAGNOSIS — Z01812 Encounter for preprocedural laboratory examination: Secondary | ICD-10-CM | POA: Diagnosis not present

## 2020-10-29 DIAGNOSIS — R3121 Asymptomatic microscopic hematuria: Secondary | ICD-10-CM | POA: Diagnosis not present

## 2020-11-02 DIAGNOSIS — N390 Urinary tract infection, site not specified: Secondary | ICD-10-CM | POA: Diagnosis not present

## 2020-11-02 DIAGNOSIS — G40909 Epilepsy, unspecified, not intractable, without status epilepticus: Secondary | ICD-10-CM | POA: Diagnosis not present

## 2020-11-02 DIAGNOSIS — Z01812 Encounter for preprocedural laboratory examination: Secondary | ICD-10-CM | POA: Diagnosis not present

## 2020-11-02 DIAGNOSIS — M8580 Other specified disorders of bone density and structure, unspecified site: Secondary | ICD-10-CM | POA: Diagnosis not present

## 2020-11-02 DIAGNOSIS — E559 Vitamin D deficiency, unspecified: Secondary | ICD-10-CM | POA: Diagnosis not present

## 2020-11-02 DIAGNOSIS — Z13220 Encounter for screening for lipoid disorders: Secondary | ICD-10-CM | POA: Diagnosis not present

## 2020-11-02 DIAGNOSIS — F0391 Unspecified dementia with behavioral disturbance: Secondary | ICD-10-CM | POA: Diagnosis not present

## 2020-11-02 DIAGNOSIS — R799 Abnormal finding of blood chemistry, unspecified: Secondary | ICD-10-CM | POA: Diagnosis not present

## 2020-11-02 DIAGNOSIS — R3121 Asymptomatic microscopic hematuria: Secondary | ICD-10-CM | POA: Diagnosis not present

## 2020-11-09 DIAGNOSIS — F0391 Unspecified dementia with behavioral disturbance: Secondary | ICD-10-CM | POA: Diagnosis not present

## 2020-11-18 IMAGING — US ULTRASOUND LEFT BREAST LIMITED
1 series · 6 of 6 positions shown · non-contrast
Comparison: Previous exam(s).

CLINICAL DATA: 67-year-old female presenting for evaluation of a
palpable lump in the inferior left breast identified on clinical
breast exam.

EXAM:
DIGITAL DIAGNOSTIC BILATERAL MAMMOGRAM WITH CAD AND TOMO
LEFT BREAST ULTRASOUND

[Series 1: ultrasound left breast limited · 0.05mm/px · 6 of 6 slices shown]
[im 1/6]
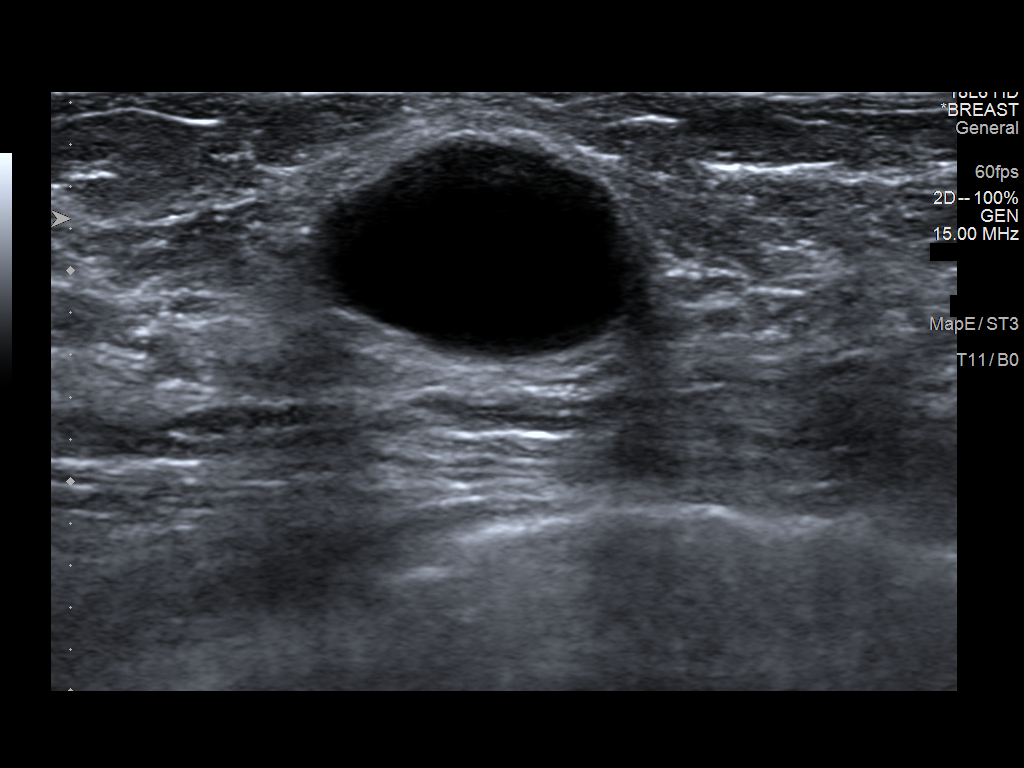
[im 2/6]
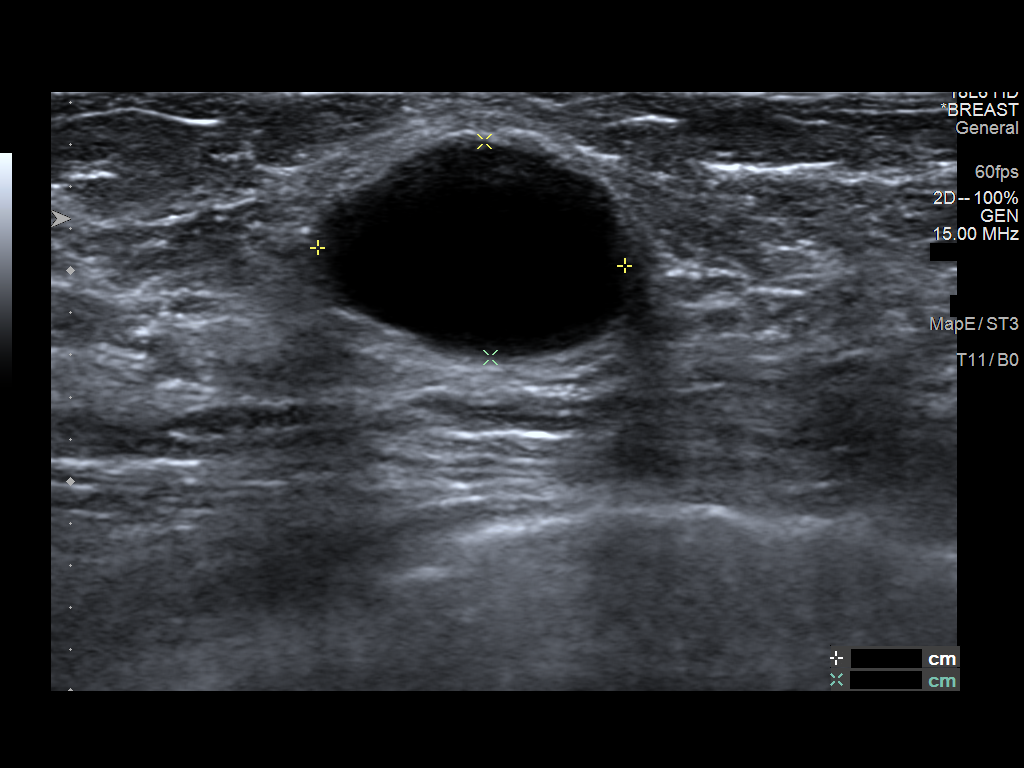
[im 3/6]
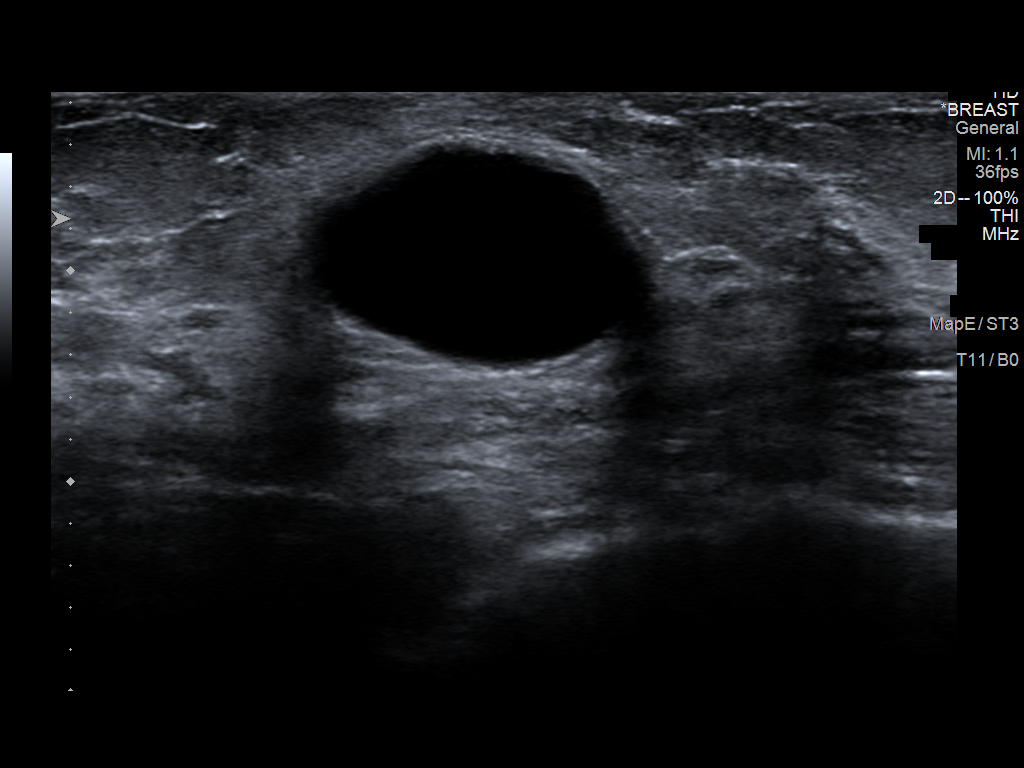
[im 4/6]
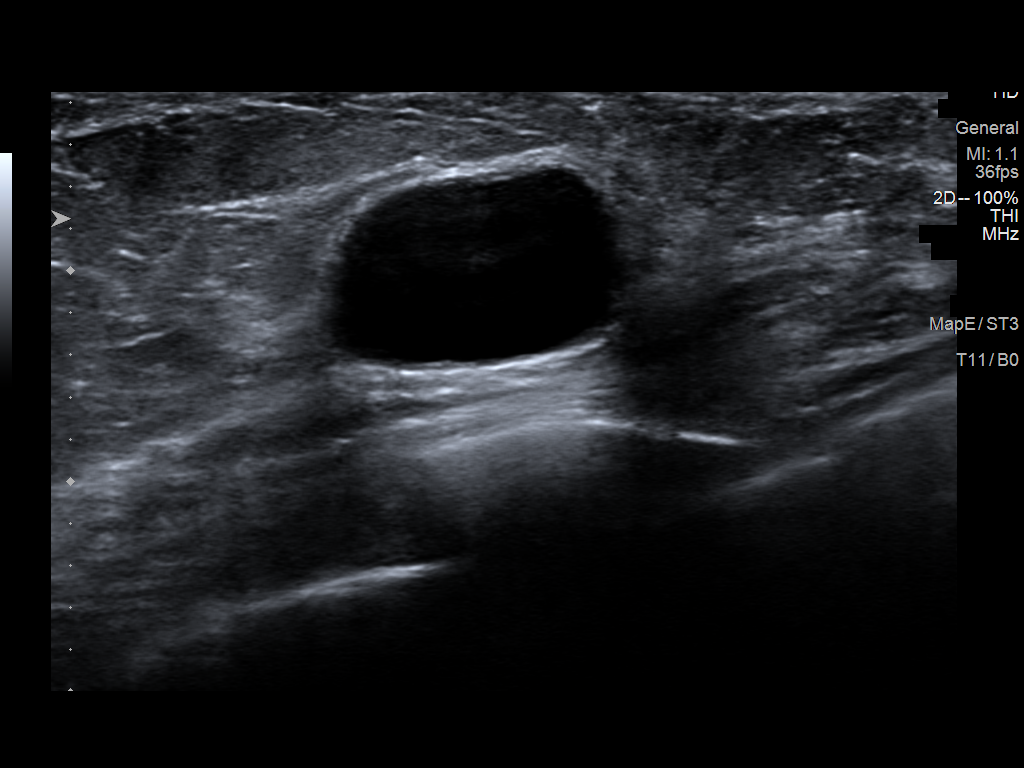
[im 5/6]
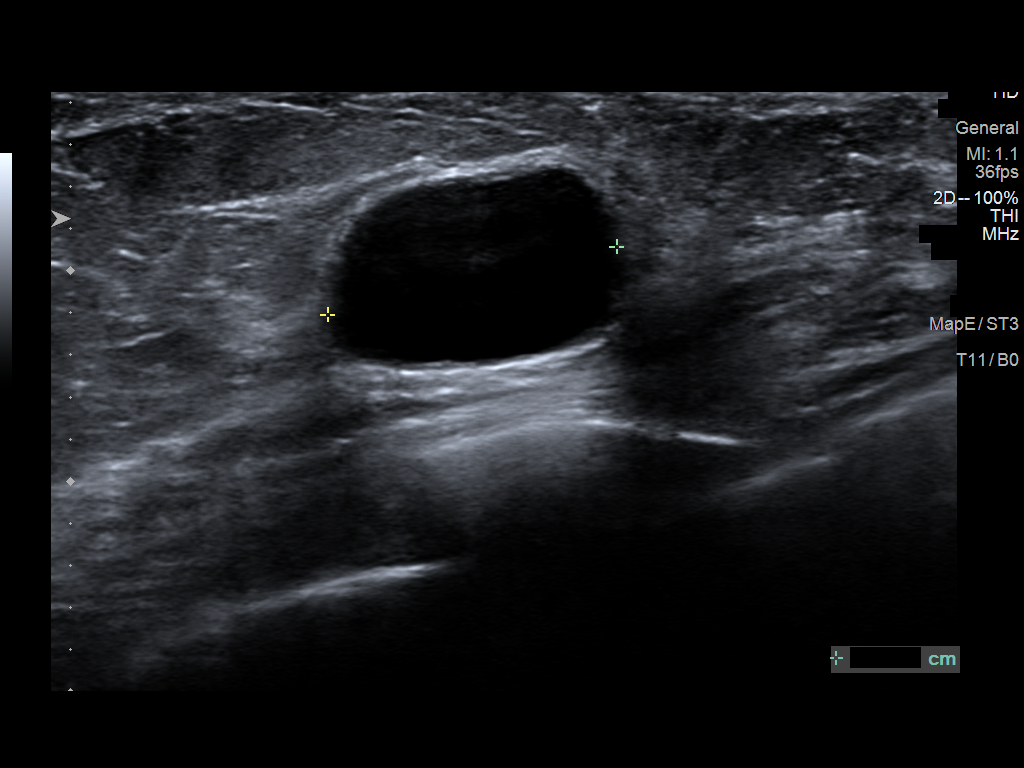
[im 6/6]
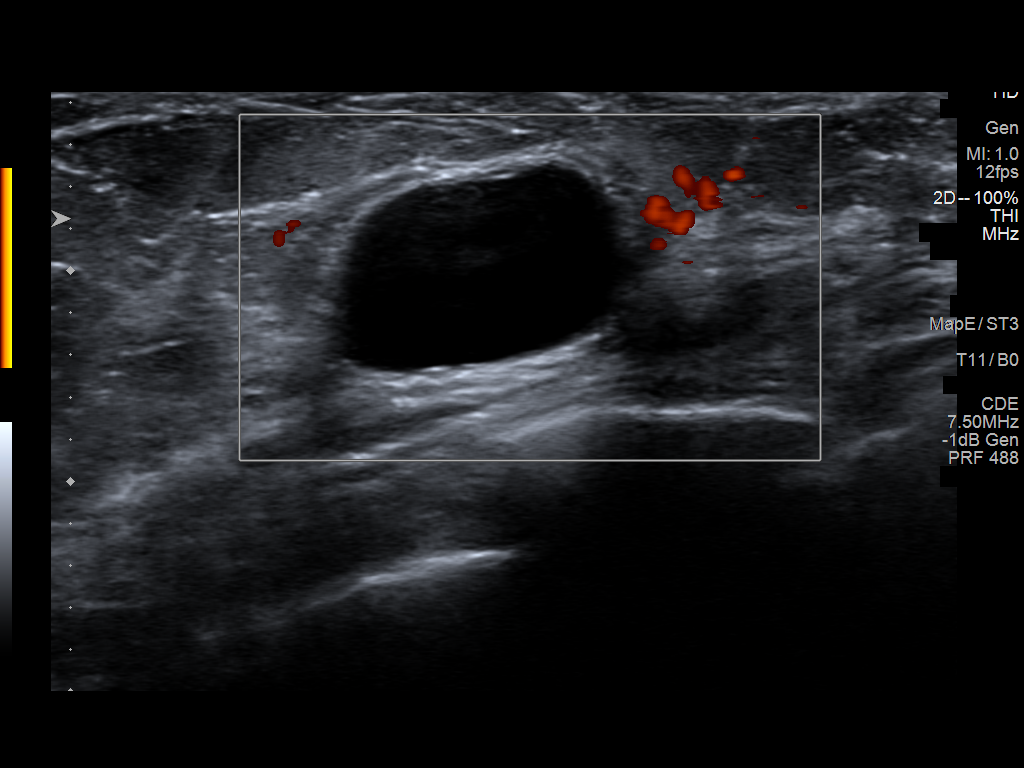

[6 of 6 positions shown; findings below may reference images not displayed]

ACR Breast Density Category c: The breast tissue is heterogeneously
dense, which may obscure small masses.
FINDINGS: In the inferior left breast there is an oval circumscribed mass
measuring approximately 1.9 cm which has been present and stable
since at least 0440. No other suspicious calcifications, masses or
areas of distortion are seen in the bilateral breasts.

Mammographic images were processed with CAD.

Physical exam of the inferior left breast demonstrates a smooth oval
mass at the 6 o'clock position, corresponding in size and location
with the mass identified mammographically.

Ultrasound targeted to the left breast at 6 o'clock, 3 cm from the
nipple demonstrates an anechoic oval circumscribed mass measuring
1.5 x 1.0 x 1.4 cm. No suspicious masses or areas of shadowing are
identified.
IMPRESSION: 1. The palpable mass in the inferior left breast corresponds with a
benign cyst.

2.  No mammographic evidence of malignancy in the bilateral breasts.

RECOMMENDATION:
Screening mammogram in one year.(Code:GG-D-HFV)

I have discussed the findings and recommendations with the patient.
Results were also provided in writing at the conclusion of the
visit. If applicable, a reminder letter will be sent to the patient
regarding the next appointment.

BI-RADS CATEGORY  2: Benign.

## 2020-11-24 DIAGNOSIS — R159 Full incontinence of feces: Secondary | ICD-10-CM | POA: Diagnosis not present

## 2020-11-24 DIAGNOSIS — G40909 Epilepsy, unspecified, not intractable, without status epilepticus: Secondary | ICD-10-CM | POA: Diagnosis not present

## 2020-11-24 DIAGNOSIS — M8580 Other specified disorders of bone density and structure, unspecified site: Secondary | ICD-10-CM | POA: Diagnosis not present

## 2020-11-24 DIAGNOSIS — R3121 Asymptomatic microscopic hematuria: Secondary | ICD-10-CM | POA: Diagnosis not present

## 2020-11-24 DIAGNOSIS — E559 Vitamin D deficiency, unspecified: Secondary | ICD-10-CM | POA: Diagnosis not present

## 2020-11-24 DIAGNOSIS — F0391 Unspecified dementia with behavioral disturbance: Secondary | ICD-10-CM | POA: Diagnosis not present

## 2020-11-24 DIAGNOSIS — Z01812 Encounter for preprocedural laboratory examination: Secondary | ICD-10-CM | POA: Diagnosis not present

## 2020-12-07 ENCOUNTER — Other Ambulatory Visit: Payer: Self-pay | Admitting: Neurology

## 2020-12-16 DIAGNOSIS — E559 Vitamin D deficiency, unspecified: Secondary | ICD-10-CM | POA: Diagnosis not present

## 2020-12-16 DIAGNOSIS — F0391 Unspecified dementia with behavioral disturbance: Secondary | ICD-10-CM | POA: Diagnosis not present

## 2020-12-16 DIAGNOSIS — M8580 Other specified disorders of bone density and structure, unspecified site: Secondary | ICD-10-CM | POA: Diagnosis not present

## 2020-12-16 DIAGNOSIS — R3121 Asymptomatic microscopic hematuria: Secondary | ICD-10-CM | POA: Diagnosis not present

## 2021-01-04 ENCOUNTER — Other Ambulatory Visit: Payer: Self-pay | Admitting: Neurology

## 2021-01-05 NOTE — Telephone Encounter (Signed)
Enough given until follow up 01/27/2021 with Dr.Jaffe, no further refills until seen

## 2021-01-27 ENCOUNTER — Telehealth (INDEPENDENT_AMBULATORY_CARE_PROVIDER_SITE_OTHER): Payer: Medicare Other | Admitting: Neurology

## 2021-01-27 ENCOUNTER — Other Ambulatory Visit: Payer: Self-pay

## 2021-01-27 DIAGNOSIS — G3101 Pick's disease: Secondary | ICD-10-CM

## 2021-01-27 DIAGNOSIS — R569 Unspecified convulsions: Secondary | ICD-10-CM

## 2021-01-27 DIAGNOSIS — F0280 Dementia in other diseases classified elsewhere without behavioral disturbance: Secondary | ICD-10-CM

## 2021-01-27 NOTE — Progress Notes (Signed)
Due to the COVID-19 crisis, this telephone visit was done via telephone from my office and it was initiated and consent given by this patient and or family.  Telephone (Audio) Visit The purpose of this telephone visit is to provide medical care while limiting exposure to the novel coronavirus.  Due to inability to connect for a video virtual visit, appointment was changed to a telephone encounter.   Consent was obtained for telephone visit and initiated by pt/family:  Yes.   Answered questions that patient had about telehealth interaction:  Yes.   I discussed the limitations, risks, security and privacy concerns of performing an evaluation and management service by telephone. I also discussed with the patient that there may be a patient responsible charge related to this service. The patient expressed understanding and agreed to proceed.  Pt location: Home Physician Location: office Name of referring provider:  Deland Pretty, MD I connected with .Harriet Pho, patient's husband, at his initiation/request on 01/27/2021 at  1:30 PM EDT by telephone and verified that I am speaking with the correct person using two identifiers.  Pt MRN:  [id number] Pt DOB:  [birth year]  Assessment/Plan:   Primary progressive aphasia Isolated seizure  1  Continue Keppra 581m twice daily 2.  Continue Seroquel 537min afternoon and 10040mt night 3.  24 hour supervision 4.  Follow up in 6 months.  Need for in person visit now:  No.  Subjective:   CynJolita Haefner a 69 58ar old right-handed woman with logopenic primary progressive aphasia and dementia who follows up for primary progressive aphasia.  I spoke with patient's husband, as Mrs. Cegielski is a poor historian.   UPDATE: Current medications: Seroquel 48m8m lunch and 48mg51mdinner;  Keppra 500mg 31mL) twice daily.  She [facility] the house on Monday through Friday from 1 to 5 PM.  She primarily makes sure that  she does not harm herself.  This provides time for her husband to have personal time and run errands.  Mrs. SchrumBraun paces around the downstairs of the house but her CNA has been able to get her to sit with her a couple of hours a day as she folds and unfolds towels.  As she wanders from room to room, she will turn on lights without turning them off.  She has no interest in watching in activities such as watching TV or working on puzzles or coloring books.  She continues to exhibit obsessive-compulsive behavior but less destructive.  She no longer repeatedly is tying knots in the pull cords of the window blinds or bend the plastic blinds until they snap off.  However, she still removes towels from the kitchen and constantly folds and unfolds them.  She turns on the water at the sink of the master bedroom for no discernible reason.  She frequently unmakes the bed.  She will remove items around the house and place them in different rooms.  She will pull off pieces from the paper towel roll, fold them and place them in various rooms.  She removes knobs from the kitchen cabinets.    Her husband has removed the knobs on the stove.    Appetite is good but she has difficulty using utensils.  Agitation is improved since increasing quetiapine a year ago.  She sleeps about 11 to 12 hours a night from 7:30 PM to 6:30-7:30 AM.  Overall, she has not been waking up in the middle  of the night to use the bathroom.  She requires adult diapers.  Due to difficulty swallowing, she is no longer taking memantine.      Her husband has noted continued cognitive decline.  She does not appear to have any joy in life.  She often paces through the house.  She has no interest in watching TV, working on puzzles or coloring books and rarely any interest in magazines.  She has fixated on window blinds for the windows in certain rooms and has tried removing the wooden slats or ties knots in the pull cords.  She frequently picks up objects  around the house and moves them to other rooms.  Table lamps and glass objects had to be removed to prevent her from accidentally breaking them or harming herself.  She breaks off pieces of paper towel or toilet tissue from the roll, folds them and places them in several parts of the house.  She will turn on lights room from room but not turn them off.  In the kitchen she will leave the oven door or refrigerator open and has pulled off the knobs from the stove range and cabinets.  Appetite is good.  She typically goes to bed about an hour after dinner, around 7:30 PM and sleeps until 5 AM.  She usually wakes up around 12 or 1 AM to use the bathroom and may become disoriented and wander in the house.  She has mistook the laundry room for the bathroom and has urinated into the cleaning bucket.  She appears more irritable and moody in the mornings.     Mr. Vantuyl sister unexpectedly passed away.  He will be going away to the funeral for a day.  CNAs from [place] will be staying at home with Mrs. Barhorst.   HISTORY: Her husband began noticing problems with speech and language in 2015-2016, which has progressed.  At first, she would mispronounce words or phrases.  She then had difficulty carrying on a conversation as well as difficulty with reading.  MRI of brain with and without contrast from 04/16/15 showed mild cerebral atrophy but no mass lesions or acute intracranial process.  She then had trouble with numbers and trouble correctly punching the buttons on the microwave.  When she writes a check, she is able to write in the numbers but struggles spelling out the numbers.  She underwent neuropsychological testing on 07/12/15.  She demonstrated deficits of expressive language, repetition, confrontation naming, verbal fluency and comprehension, consistent with logopenic variant primary progressive aphasia.  She exhibited frequent phonemic paraphasias, circumlocution and slightly improved fluency of speech during  casual conversation versus response to specific or complex questions. She also met criteria for adjustment disorder with anxiety.  RPR nonreactive, B12 1056, TSH 1.020.   On the morning of 03/14/2019, she got out of bed and started talking to the bathroom mirror, which is not new.  Her husband found her on the floor convulsing with facial twitching.  She was unresponsive.  She regained consciousness after about 10 minutes.  She was taken to [facility] ED for further evaluation.  Vitals were normal.  CT of head and cervical spine showed no acute abnormalities.  Labs, including CBC, troponin, electrolytes and troponin were unremarkable.  EKG was unremarkable.  She received a loading dose of Keppra and was discharged on 567m twice daily.  She has not had any recurrent seizure.   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  Her mother was diagnosed with dementia late in life and passed away in her 71s.  Her maternal aunt and cousin also had dementia.   Past medication:  Lexapro 97m; citalopram; Aricept (discontinued as it may lower seizure threshold); memantine (no longer able to swallow, likely no longer efficacious)   Objective:   There were no vitals filed for this visit.   Follow Up Instructions:      -I discussed the assessment and treatment plan with the patient. The patient was provided an opportunity to ask questions and all were answered. The patient agreed with the plan and demonstrated an understanding of the instructions.   The patient was advised to call back or seek an in-person evaluation if the symptoms worsen or if the condition fails to improve as anticipated.    Total Time spent in visit with the patient was:  15 minutes   ADudley Major DO

## 2021-02-12 ENCOUNTER — Other Ambulatory Visit: Payer: Self-pay | Admitting: Neurology

## 2021-02-23 ENCOUNTER — Other Ambulatory Visit: Payer: Self-pay | Admitting: Neurology

## 2021-02-23 MED ORDER — QUETIAPINE FUMARATE 25 MG PO TABS: 50.0000 mg | ORAL_TABLET | Freq: Two times a day (BID) | ORAL | 5 refills | Status: DC

## 2021-03-14 DIAGNOSIS — R319 Hematuria, unspecified: Secondary | ICD-10-CM | POA: Diagnosis not present

## 2021-07-05 DIAGNOSIS — Z0279 Encounter for issue of other medical certificate: Secondary | ICD-10-CM

## 2021-07-05 NOTE — Progress Notes (Signed)
Paperwork filled out and given to the front to call and ask husband if he wants [place] to fax to company or will he come pick it up.

## 2021-08-04 ENCOUNTER — Other Ambulatory Visit: Payer: Self-pay | Admitting: Neurology

## 2021-08-08 ENCOUNTER — Encounter: Payer: Self-pay | Admitting: Neurology

## 2021-08-17 ENCOUNTER — Telehealth (INDEPENDENT_AMBULATORY_CARE_PROVIDER_SITE_OTHER): Payer: Medicare Other | Admitting: Neurology

## 2021-08-17 ENCOUNTER — Encounter: Payer: Self-pay | Admitting: Neurology

## 2021-08-17 DIAGNOSIS — G3101 Pick's disease: Secondary | ICD-10-CM

## 2021-08-17 DIAGNOSIS — F0280 Dementia in other diseases classified elsewhere without behavioral disturbance: Secondary | ICD-10-CM

## 2021-08-17 DIAGNOSIS — R569 Unspecified convulsions: Secondary | ICD-10-CM | POA: Diagnosis not present

## 2021-08-17 NOTE — Progress Notes (Signed)
? ?Virtual Visit via Video Note ?The purpose of this virtual visit is to provide medical care while limiting exposure to the novel coronavirus.   ? ?Consent was obtained for video visit:  Yes.   ?Answered questions that patient had about telehealth interaction:  Yes.   ?I discussed the limitations, risks, security and privacy concerns of performing an evaluation and management service by telemedicine. I also discussed with the patient that there may be a patient responsible charge related to this service. The patient expressed understanding and agreed to proceed. ? ?Pt location: Home ?Physician Location: office ?Name of referring provider:  Deland Pretty, MD ?I connected with Michele Porter at patients initiation/request on 08/17/2021 at  2:30 PM EDT by video enabled telemedicine application and verified that I am speaking with the correct person using two identifiers. ?Pt MRN:  [id number] ?Pt DOB:  12/07/1950 ?Video Participants:  REEM FLEURY;  her husband ? ?Assessment and Plan:   ? ?progressive aphasia ?Isolated seizure ?  ?1  Continue Keppra 523m twice daily ?2.  Continue Seroquel 54min afternoon and 1004mt night ?3.  24 hour supervision ?4.  Follow up in 6 months. ?  ? ?Subjective:  ?  ?Michele Porter a 71 35ar old right-handed woman with logopenic primary progressive aphasia and dementia who follows up for primary progressive aphasia.  I spoke with patient's husband, as Michele Porter is a poor historian. ?  ?UPDATE: ?Current medications: Seroquel 51m37m lunch and 51mg33mdinner;  Keppra 500mg 93mL) twice daily. ? ?[facility] through Friday from 9 AM to 5 PM.  She primarily makes sure that she does not harm herself.  This provides time for her husband to have personal time and run errands.  She still paces.  She sleeps 11-12 hours but resfed in the morning.  Seroquel continues to be helpful controlling agitation and sleep through the  night.  No recurrent seizures.  Gait is now more of a shuffle but no falls.  Appetite remains good but requires assistance to eat.  She has trouble keeping food on her fork or spoon.   ? ?  ?HISTORY: ?Her husband began noticing problems with speech and language in 2015-2016, which has progressed.  At first, she would mispronounce words or phrases.  She then had difficulty carrying on a conversation as well as difficulty with reading.  MRI of brain with and without contrast from 04/16/15 showed mild cerebral atrophy but no mass lesions or acute intracranial process.  She then had trouble with numbers and trouble correctly punching the buttons on the microwave.  When she writes a check, she is able to write in the numbers but struggles spelling out the numbers.  She underwent neuropsychological testing on 07/12/15.  She demonstrated deficits of expressive language, repetition, confrontation naming, verbal fluency and comprehension, consistent with logopenic variant primary progressive aphasia.  She exhibited frequent phonemic paraphasias, circumlocution and slightly improved fluency of speech during casual conversation versus response to specific or complex questions. She also met criteria for adjustment disorder with anxiety.  RPR nonreactive, B12 1056, TSH 1.020. ?  ?On the morning of 03/14/2019, she got out of bed and started talking to the bathroom mirror, which is not new.  Her husband found her on the floor convulsing with facial twitching.  She was unresponsive.  She regained consciousness after about 10 minutes.  [facility]  Vitals were normal.  CT of head and cervical spine showed no acute abnormalities.  Labs, including CBC, troponin, electrolytes and troponin were unremarkable.  EKG was unremarkable.  She received a loading dose of Keppra and was discharged on 520m twice daily.  She has not had any recurrent seizure. ?  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 ?  ?Past medication:  Lexapro 139m citalopram; Aricept (discontinued as it may lower seizure threshold); memantine (no longer able to swallow, likely no longer efficacious) ? ?Past Medical History: ?Past Medical History:  ?Diagnosis Date  ? D[place] ([place]  ? ? ?Medications: ?Outpatient Encounter Medications as of 08/17/2021  ?Medication Sig  ? alendronate (FOSAMAX) 70 MG tablet Take 70 mg by mouth once a week. Take with a full glass of water on an empty stomach.  ? CALCIUM PO Take 1 tablet by mouth daily.  ? escitalopram (LEXAPRO) 10 MG tablet Take 1 tablet (10 mg total) by mouth daily.  ? levETIRAcetam (KEPPRA) 100 MG/ML solution TAKE 5 ML BY MOUTH TWICE DAILY  ? memantine (NAMENDA) 10 MG tablet TAKE 1 TABLET BY MOUTH  TWICE DAILY  ? Multiple Vitamins-Minerals (MULTIVITAMIN PO) Take 1 tablet by mouth daily.  ? Omega-3 Fatty Acids (FISH OIL PO) Take 1 capsule by mouth daily.  ? QUEtiapine (SEROQUEL) 25 MG tablet TAKE 2 TABLETS BY MOUTH  TWICE DAILY  ? [DISCONTINUED] citalopram (CELEXA) 20 MG tablet Take 1 tablet (20 mg total) by mouth daily. (Patient not taking: Reported on 01/22/2020)  ? [DISCONTINUED] donepezil (ARICEPT) 10 MG tablet TAKE 1 TABLET BY MOUTH AT  BEDTIME (Patient not taking: Reported on 01/22/2020)  ? ?No facility-administered encounter medications on file as of 08/17/2021.  ? ? ?Allergies: ?Allergies  ?Allergen Reactions  ? Sulfa Antibiotics   ? ? ?Family History: ?Family History  ?Problem Relation Age of Onset  ? Dementia Mother   ? Pancreatic cancer Father   ? ? ?Observations/Objective:   ? ?No acute distress.  Not oriented.  Unable to speak. ? ? ?Follow Up Instructions: ?  ? -I discussed the assessment and treatment plan with the patient. The patient was provided an opportunity to  ask questions and all were answered. The patient agreed with the plan and demonstrated an understanding of the instructions. ?  ?The patient was advised to call back or seek an in-person evaluation if the symptoms worsen or if the condition fails to improve as anticipated. ? ? ? ?AdDudley MajorDO ?

## 2021-08-31 ENCOUNTER — Other Ambulatory Visit: Payer: Self-pay | Admitting: Neurology

## 2021-09-29 ENCOUNTER — Encounter (HOSPITAL_COMMUNITY): Payer: Self-pay

## 2021-09-29 ENCOUNTER — Emergency Department (HOSPITAL_COMMUNITY): Payer: Medicare Other

## 2021-09-29 ENCOUNTER — Other Ambulatory Visit: Payer: Self-pay

## 2021-09-29 ENCOUNTER — Emergency Department (HOSPITAL_COMMUNITY)
Admission: EM | Admit: 2021-09-29 | Discharge: 2021-09-29 | Disposition: A | Discharge status: Home / Self Care | Payer: Medicare Other | Attending: Emergency Medicine | Admitting: Emergency Medicine

## 2021-09-29 ENCOUNTER — Encounter: Payer: Self-pay | Admitting: Neurology

## 2021-09-29 DIAGNOSIS — M50322 Other cervical disc degeneration at C5-C6 level: Secondary | ICD-10-CM | POA: Diagnosis not present

## 2021-09-29 DIAGNOSIS — R400 Somnolence: Secondary | ICD-10-CM | POA: Insufficient documentation

## 2021-09-29 DIAGNOSIS — S199XXA Unspecified injury of neck, initial encounter: Secondary | ICD-10-CM | POA: Diagnosis not present

## 2021-09-29 DIAGNOSIS — S0990XA Unspecified injury of head, initial encounter: Secondary | ICD-10-CM | POA: Diagnosis not present

## 2021-09-29 DIAGNOSIS — F419 Anxiety disorder, unspecified: Secondary | ICD-10-CM | POA: Diagnosis not present

## 2021-09-29 DIAGNOSIS — R569 Unspecified convulsions: Secondary | ICD-10-CM | POA: Insufficient documentation

## 2021-09-29 DIAGNOSIS — Z79899 Other long term (current) drug therapy: Secondary | ICD-10-CM | POA: Diagnosis not present

## 2021-09-29 DIAGNOSIS — R404 Transient alteration of awareness: Secondary | ICD-10-CM | POA: Diagnosis not present

## 2021-09-29 DIAGNOSIS — F0390 Unspecified dementia without behavioral disturbance: Secondary | ICD-10-CM | POA: Diagnosis not present

## 2021-09-29 DIAGNOSIS — I959 Hypotension, unspecified: Secondary | ICD-10-CM | POA: Diagnosis not present

## 2021-09-29 DIAGNOSIS — G319 Degenerative disease of nervous system, unspecified: Secondary | ICD-10-CM | POA: Diagnosis not present

## 2021-09-29 DIAGNOSIS — W19XXXA Unspecified fall, initial encounter: Secondary | ICD-10-CM | POA: Diagnosis not present

## 2021-09-29 HISTORY — DX: Unspecified convulsions: R56.9

## 2021-09-29 LAB — CBC WITH DIFFERENTIAL/PLATELET
Abs Immature Granulocytes: 0.05 10*3/uL (ref 0.00–0.07)
Basophils Absolute: 0.1 10*3/uL (ref 0.0–0.1)
Basophils Relative: 1 %
Eosinophils Absolute: 0.1 10*3/uL (ref 0.0–0.5)
Eosinophils Relative: 1 %
HCT: 43.1 % (ref 36.0–46.0)
Hemoglobin: 14.7 g/dL (ref 12.0–15.0)
Immature Granulocytes: 1 %
Lymphocytes Relative: 17 %
Lymphs Abs: 1.6 10*3/uL (ref 0.7–4.0)
MCH: 31.3 pg (ref 26.0–34.0)
MCHC: 34.1 g/dL (ref 30.0–36.0)
MCV: 91.9 fL (ref 80.0–100.0)
Monocytes Absolute: 0.4 10*3/uL (ref 0.1–1.0)
Monocytes Relative: 5 %
Neutro Abs: 7.2 10*3/uL (ref 1.7–7.7)
Neutrophils Relative %: 75 %
Platelets: 300 10*3/uL (ref 150–400)
RBC: 4.69 MIL/uL (ref 3.87–5.11)
RDW: 11.9 % (ref 11.5–15.5)
WBC: 9.4 10*3/uL (ref 4.0–10.5)
nRBC: 0 % (ref 0.0–0.2)

## 2021-09-29 LAB — BASIC METABOLIC PANEL
Anion gap: 7 (ref 5–15)
BUN: 14 mg/dL (ref 8–23)
CO2: 25 mmol/L (ref 22–32)
Calcium: 9.5 mg/dL (ref 8.9–10.3)
Chloride: 112 mmol/L — ABNORMAL HIGH (ref 98–111)
Creatinine, Ser: 1.11 mg/dL — ABNORMAL HIGH (ref 0.44–1.00)
GFR, Estimated: 53 mL/min — ABNORMAL LOW (ref >60)
Glucose, Bld: 100 mg/dL — ABNORMAL HIGH (ref 70–99)
Potassium: 4 mmol/L (ref 3.5–5.1)
Sodium: 144 mmol/L (ref 135–145)

## 2021-09-29 MED ORDER — LEVETIRACETAM IN NACL 1000 MG/100ML IV SOLN
1000.0000 mg | Freq: Once | INTRAVENOUS | Status: AC
  Administered 2021-09-29: 1000 mg via INTRAVENOUS
  Filled 2021-09-29: qty 100

## 2021-09-29 NOTE — ED Provider Notes (Signed)
[facility] EMERGENCY DEPARTMENT Provider Note   CSN: [id number] Arrival date & time: 09/29/21  7893     History  Chief Complaint  Patient presents with   Lytle Michaels    JAHMYA ONOFRIO is a 71 y.o. female.  Patient is a 71 year old female with a history of advanced dementia, one-time seizure in the past on Keppra who lives at home with her husband is presenting today with EMS due to a suspected seizure.  Patient's husband found her laying on the floor in the bedroom after he heard a thump and he felt like she was having seizure-like activity.  Unclear how long this lasted as patient's husband is not currently present.  EMS reports when they arrived patient seemed postictal as she was lethargic, decreased responsiveness which is now improved and she has become very anxious.  Her husband had reported she is nonverbal.  There were no other reported issues prior to the seizure.  EMS did not note any sign of trauma.  The history is provided by the EMS personnel, a caregiver and medical records.  Fall      Home Medications Prior to Admission medications   Medication Sig Start Date End Date Taking? Authorizing Provider  alendronate (FOSAMAX) 70 MG tablet Take 70 mg by mouth once a week. Take with a full glass of water on an empty stomach.    [provider]  CALCIUM PO Take 1 tablet by mouth daily.    [provider]  escitalopram (LEXAPRO) 10 MG tablet Take 1 tablet (10 mg total) by mouth daily. 10/24/18   Tomi Likens, Adam R, DO  levETIRAcetam (KEPPRA) 100 MG/ML solution TAKE 5 ML BY MOUTH TWICE  DAILY 08/31/21   Tomi Likens, Adam R, DO  memantine (NAMENDA) 10 MG tablet TAKE 1 TABLET BY MOUTH  TWICE DAILY 09/25/19   Pieter Partridge, DO  Multiple Vitamins-Minerals (MULTIVITAMIN PO) Take 1 tablet by mouth daily.    [provider]  Omega-3 Fatty Acids (FISH OIL PO) Take 1 capsule by mouth daily.    [provider]  QUEtiapine (SEROQUEL) 25 MG tablet TAKE 2  TABLETS BY MOUTH  TWICE DAILY 08/05/21   Tomi Likens, Adam R, DO  citalopram (CELEXA) 20 MG tablet Take 1 tablet (20 mg total) by mouth daily. Patient not taking: Reported on 01/22/2020 12/25/18 04/19/20  Pieter Partridge, DO  donepezil (ARICEPT) 10 MG tablet TAKE 1 TABLET BY MOUTH AT  BEDTIME Patient not taking: Reported on 01/22/2020 09/25/19 04/19/20  Pieter Partridge, DO      Allergies    Sulfa antibiotics    Review of Systems   Review of Systems  Physical Exam Updated Vital Signs BP 134/78 (BP Location: Right Arm)   Pulse 60   Temp 97.6 F (36.4 C) (Axillary)   Resp 15   Ht '5\' 3"'$  (1.6 m)   Wt 54.4 kg   SpO2 99%   BMI 21.26 kg/m  Physical Exam Vitals and nursing note reviewed.  Constitutional:      General: She is not in acute distress.    Appearance: She is well-developed.     Comments: Patient is trembling and appears anxious  HENT:     Head: Normocephalic and atraumatic.     Comments: No notable evidence of lesions to the head Eyes:     Conjunctiva/sclera: Conjunctivae normal.     Pupils: Pupils are equal, round, and reactive to light.  Cardiovascular:     Rate and Rhythm: Normal rate  and regular rhythm.     Heart sounds: No murmur heard. Pulmonary:     Effort: Pulmonary effort is normal. No respiratory distress.     Breath sounds: Normal breath sounds. No wheezing or rales.  Abdominal:     General: There is no distension.     Palpations: Abdomen is soft.     Tenderness: There is no abdominal tenderness. There is no guarding or rebound.  Musculoskeletal:        General: No tenderness. Normal range of motion.     Cervical back: Normal range of motion and neck supple. No tenderness.  Skin:    General: Skin is warm and dry.     Findings: No erythema or rash.  Neurological:     Mental Status: She is alert.     Comments: Awake but nonverbal.  Noted to move all 4 extremities without difficulty  Psychiatric:     Comments: Anxious    ED Results / Procedures / Treatments    Labs (all labs ordered are listed, but only abnormal results are displayed) Labs Reviewed  BASIC METABOLIC PANEL - Abnormal; Notable for the following components:      Result Value   Chloride 112 (*)    Glucose, Bld 100 (*)    Creatinine, Ser 1.11 (*)    GFR, Estimated 53 (*)    All other components within normal limits  CBC WITH DIFFERENTIAL/PLATELET    EKG EKG Interpretation  Date/Time:  Thursday Sep 29 2021 10:06:24 EDT Ventricular Rate:  58 PR Interval:  255 QRS Duration: 92 QT Interval:  434 QTC Calculation: 427 R Axis:   67 Text Interpretation: Sinus rhythm new  Prolonged PR interval Confirmed by Blanchie Dessert [phone]) on 09/29/2021 10:09:39 AM  Radiology CT Head Wo Contrast  Result Date: 09/29/2021 CLINICAL DATA:  Head trauma, minor (Age >= 65y); Neck trauma (Age >= 65y) EXAM: CT HEAD WITHOUT CONTRAST CT CERVICAL SPINE WITHOUT CONTRAST TECHNIQUE: Multidetector CT imaging of the head and cervical spine was performed following the standard protocol without intravenous contrast. Multiplanar CT image reconstructions of the cervical spine were also generated. RADIATION DOSE REDUCTION: This exam was performed according to the departmental dose-optimization program which includes automated exposure control, adjustment of the mA and/or kV according to patient size and/or use of iterative reconstruction technique. COMPARISON:  CT head and CT cervical spine 04/19/2020. FINDINGS: CT HEAD FINDINGS Brain: No evidence of acute infarction, hemorrhage, hydrocephalus, extra-axial collection or mass lesion/mass effect. Cerebral atrophy. Vascular: Calcific intracranial atherosclerosis. No hyperdense vessel identified. Skull: No acute fracture. Sinuses/Orbits: Clear visualized sinuses. No acute orbital findings. Other: No mastoid effusions. CT CERVICAL SPINE FINDINGS Motion limited. Alignment: No substantial sagittal subluxation. Skull base and vertebrae: Vertebral body heights are maintained.  Soft tissues and spinal canal: No prevertebral fluid or swelling. No visible canal hematoma. Disc levels: Multilevel facet uncovertebral hypertrophy with varying degrees of neuroforaminal stenosis. Mild-to-moderate multilevel degenerative disc disease, most pronounced at C5-C6 posteriorly. Upper chest: Visualized lung apices are clear. IMPRESSION: 1. No evidence of acute intracranial abnormality. 2. No evidence of acute fracture or traumatic malalignment in the cervical spine on motion limited study. Electronically Signed   By: Margaretha Sheffield M.D.   On: 09/29/2021 12:38   CT Cervical Spine Wo Contrast  Result Date: 09/29/2021 CLINICAL DATA:  Head trauma, minor (Age >= 65y); Neck trauma (Age >= 65y) EXAM: CT HEAD WITHOUT CONTRAST CT CERVICAL SPINE WITHOUT CONTRAST TECHNIQUE: Multidetector CT imaging of the head and cervical spine was performed  following the standard protocol without intravenous contrast. Multiplanar CT image reconstructions of the cervical spine were also generated. RADIATION DOSE REDUCTION: This exam was performed according to the departmental dose-optimization program which includes automated exposure control, adjustment of the mA and/or kV according to patient size and/or use of iterative reconstruction technique. COMPARISON:  CT head and CT cervical spine 04/19/2020. FINDINGS: CT HEAD FINDINGS Brain: No evidence of acute infarction, hemorrhage, hydrocephalus, extra-axial collection or mass lesion/mass effect. Cerebral atrophy. Vascular: Calcific intracranial atherosclerosis. No hyperdense vessel identified. Skull: No acute fracture. Sinuses/Orbits: Clear visualized sinuses. No acute orbital findings. Other: No mastoid effusions. CT CERVICAL SPINE FINDINGS Motion limited. Alignment: No substantial sagittal subluxation. Skull base and vertebrae: Vertebral body heights are maintained. Soft tissues and spinal canal: No prevertebral fluid or swelling. No visible canal hematoma. Disc levels:  Multilevel facet uncovertebral hypertrophy with varying degrees of neuroforaminal stenosis. Mild-to-moderate multilevel degenerative disc disease, most pronounced at C5-C6 posteriorly. Upper chest: Visualized lung apices are clear. IMPRESSION: 1. No evidence of acute intracranial abnormality. 2. No evidence of acute fracture or traumatic malalignment in the cervical spine on motion limited study. Electronically Signed   By: Margaretha Sheffield M.D.   On: 09/29/2021 12:38    Procedures Procedures    Medications Ordered in ED Medications  levETIRAcetam (KEPPRA) IVPB 1000 mg/100 mL premix (0 mg Intravenous Stopped 09/29/21 1112)    ED Course/ Medical Decision Making/ A&P                           Medical Decision Making Amount and/or Complexity of Data Reviewed Labs: ordered. Radiology: ordered.  Risk Prescription drug management.   Pt with multiple medical problems and comorbidities and presenting today with a complaint that caries a high risk for morbidity and mortality.  Presenting after what was thought to be a seizure today which caused a fall.  Patient is awake and alert now and seems to be at her baseline however will confirm with patient's husband when he arrives.  Patient does take Keppra 500 mg twice daily for prior seizure activity.  She was given a bolus of 1 g of Keppra.  Labs are pending and will confirm with her husband she has not had any recent infectious symptoms or change in medications.  She does not appear significantly dehydrated.  I independently interpreted patient's EKG today and it is unchanged except for now patient has a prolonged PR interval.  No evidence of dysrhythmia at this time.  1:10 PM Patient's husband is now present and reports that she was her normal self when she woke up this morning and he went to the bathroom for 30 seconds and then heard a thump and she was having a generalized convulsion with twitching of her arms, foaming at the mouth and  unresponsiveness that he reports lasted less than 5 minutes.  He now reports she seems to be at her baseline.  She has been taking her medications regularly has had no change in medications, recent illness or decreased oral intake.  She sleeps 12 hours a night and there was nothing out of the ordinary this morning.  1:10 PM I independently interpreted patient's labs today which showed a normal CBC and BMP.  I have independently visualized and interpreted pt's images today.  Head CT without acute bleed and cervical spine without acute fracture.  Radiology report no acute abnormalities.  Findings discussed with the patient's husband.  At this time feel that she  is stable for discharge home and does not require admission.  At this time we will continue her Keppra but encouraged to follow-up with her neurologist especially if she has further seizures she may need medication adjustments.  Currently she is on Keppra 500 mg twice daily.  Patient was tolerating p.o.'s here and was able to stand and walk.          Final Clinical Impression(s) / ED Diagnoses Final diagnoses:  Seizure [facility])    Rx / DC Orders ED Discharge Orders     None         Blanchie Dessert, MD 09/29/21 1310

## 2021-09-29 NOTE — ED Triage Notes (Signed)
"  History of severe alzheimers, ambulatory at home, oriented x 0 as baseline. Spouse heard her fall, came in and found her on carpet floor twitching with possible seizure activity.She was lethargic at first, became more alert while in route. No obvious injuries" per EMS

## 2021-09-29 NOTE — ED Notes (Signed)
Patient stood with assistance and ambulated in the room with assistance from her husband. Husband reports that patient's gait is at baseline.

## 2021-11-10 ENCOUNTER — Other Ambulatory Visit: Payer: Self-pay | Admitting: Neurology

## 2021-11-28 ENCOUNTER — Telehealth: Payer: Self-pay | Admitting: Neurology

## 2021-11-28 NOTE — Telephone Encounter (Signed)
Patients husband called and wanted to speak with Sheena.  He has a few concerns over the last few weeks that he wants to discuss.

## 2021-11-29 NOTE — Telephone Encounter (Signed)
Patients husband called for sheena yesterday but she had already left for the day. He stated he needs a call back from [place] today if at all possible. Stated no one has called him back yet. He has some concerns about Lashane and needs to follow up with jaffe and  hear what he says about it. If jaffe cant call today, he wont be able to talk until Thursday. He would like a call back from a clinical team either way to let him know.

## 2021-11-29 NOTE — Telephone Encounter (Signed)
Pt husband called and informed that Dr Tomi Likens stated As she has had worsening of symptoms over the past few days which may be concerning for ongoing strokes, I think she needs to be evaluated in the hospital for acute workup. Pt husband stated that Lashayla is 7 years into dementia and and he knows that this is not going to ead well regardless. She has a DNR in place and he needs to balance heroic measures because he is her POA and husband of many years. He said he knows that [facility] may not like this but he is going to wait until the nurse sees her on Thursday because he has not offered him any help on her recovery. He said that yesterday was a good day and with help Hester walked and smiled. He said he knows the end will come he just dose not know when or how. Pt husband was advised to call 911 if her symptoms got worse to so they could take her to the hospital to be evaluated and worked up as Dr Tomi Likens suggested.

## 2021-11-29 NOTE — Telephone Encounter (Signed)
Spoke with pt husband he stated that a few weeks a ago he thinks that his wife started having mini strokes, she has predominant right side shoulder tilt, right foot drags when she walks, she has a very unsteady gate, limited uses of right hand when trying to grasp a cup or fork she has to use left hand, she has a hard time feeding her self she is missing her mouth and he is having to feed her. When she sits in her chair she is leaning to the right and they have to prop her head up with something. He stated no facial droop. She has an increase in sleep she will sleep 12 hours at night and then nap during the day for hours. Pt husband stated that they use [facility] CNA's and that on Sunday 11/26/21 he did not have any help with her can and he notice a that all of right side weakness was worse. He was advised I think that she has had a stroke that I will send this to Dr Tomi Likens and call him right back, and that Dr Tomi Likens will most likely want her to be evaluated in the ER. He stated he is not prepared for that today. He want Dr Tomi Likens to call him and talk to him for 15 to 20 mins and to wait until an RN from [facility] who is a professional who works with dementia patients comes to evaluate her. He said that he dose not think she needs to go to the ER until the nurse sees her because he thinks its just her dementia getting worse.

## 2021-12-27 ENCOUNTER — Encounter: Payer: Self-pay | Admitting: Neurology

## 2021-12-29 ENCOUNTER — Telehealth: Payer: Self-pay | Admitting: Neurology

## 2021-12-29 NOTE — Telephone Encounter (Signed)
Responded to Mr. Pouncey's My Chart message

## 2021-12-29 NOTE — Telephone Encounter (Signed)
Cynthias husband is following up with sheena. He sent a message few days ago and he would like for her to call him back

## 2021-12-29 NOTE — Telephone Encounter (Signed)
Patient husband wanted to know if Dr.Jaffe got his message and the letter he dropped off.

## 2022-02-15 NOTE — Progress Notes (Signed)
Virtual Visit via Video Note The purpose of this virtual visit is to provide medical care while limiting exposure to the novel coronavirus.    Consent was obtained for video visit:  Yes.   Answered questions that patient had about telehealth interaction:  Yes.   I discussed the limitations, risks, security and privacy concerns of performing an evaluation and management service by telemedicine. I also discussed with the patient that there may be a patient responsible charge related to this service. The patient expressed understanding and agreed to proceed.  Pt location: Home Physician Location: office Name of referring provider:  Deland Pretty, MD I connected with Michele Porter at patients initiation/request on 02/17/2022 at  2:10 PM EDT by video enabled telemedicine application and verified that I am speaking with the correct person using two identifiers. Pt MRN:  [id number] Pt DOB:  January 05, 1951 Video Participants:  Michele Porter;  her husband  Assessment and Plan:    Primary progressive aphasia Seizure disorder   1  Increase Keppra to 541m in morning and 10058mat night. 2.  To try and reduce late afternoon-early evening drowsiness, decrease afternoon quetiapine dose to 2581mcontinue 48m72m bedtime. 3.  24 hour supervision 4. As Michele Porter requires assistance transferring from chair to bed, and she can no longer assist her husband and nurse, recommend hospital bed so that the bed can be lowered, making it easier getting into and out of bed.  5  Follow up in 6 months.    Subjective:    Michele Porter 70 y1r old right-handed woman with logopenic primary progressive aphasia and dementia who follows up for primary progressive aphasia.  I spoke with patient's husband, as [name] is a poor historian.  ED note from May reviewed.   UPDATE: Current medications: Seroquel 48mg63mlunch and 48mg 92minner;  Keppra 500mg (69m) in morning and 500mg (541m at dinner  Michele Porter  continues to live at home with her husband who is her primary caregiver. She had a breakthrough seizure on 09/29/2021.  She was evaluated in the ED.  Labs including CBC and BMP were unremarkable.  CT head showed no acute abnormalities.  CT cervical spine revealed no acute fractures or subluxation.  She has not missed any doses of her Keppra as they are managed by her husband.  She was given a 1g bolus of Keppra.  We recommended to increase Keppra to 500mg in 76mnd 1000mg at d26mr but it appears that she has continued to take 500mg BID. 45mhink it is because her husband was given instructions but a new prescription was never entered.  She has not had any subsequent seizures.  She has continued to deteriorate to the point that she can no longer walk independently.  She has a lift chair to help get her up out of chairs.  She also requires a wheelchair to be moved around the house.  She has become less ambulatory and now often resorts to using a wheelchair.  Due to difficulty getting her into the small bathroom with the wheelchair, she has a portable toilet in the master bedroom.   She has a CNA (Fatima) frAdministrator, [employer] Friday from 9 AM to 5 PM and Saturday and Sunday from 1 PM to 5 PM to help change diapers, help prepare and feed meals (she can no longer feed herself), help clean the portable toilet and help with laundry and making the bed.  The RN from [employer] Ebony Hail) visits at least twice per year to check on Michele Porter's overall health.  Michele Porter has had recurrent UTIs over the past year.  She has decreased energy.  She does not exhibit much obsessive compulsive behavior now, probably due to inability to ambulate independently and having low-energy. After dinner, she is a little more unsteady, possibly due to drowsiness related to the quetiapine.    HISTORY: Her husband began noticing problems with speech and language in 2015-2016, which has progressed.  At first, she would  mispronounce words or phrases.  She then had difficulty carrying on a conversation as well as difficulty with reading.  MRI of brain with and without contrast from 04/16/15 showed mild cerebral atrophy but no mass lesions or acute intracranial process.  She then had trouble with numbers and trouble correctly punching the buttons on the microwave.  When she writes a check, she is able to write in the numbers but struggles spelling out the numbers.  She underwent neuropsychological testing on 07/12/15.  She demonstrated deficits of expressive language, repetition, confrontation naming, verbal fluency and comprehension, consistent with logopenic variant primary progressive aphasia.  She exhibited frequent phonemic paraphasias, circumlocution and slightly improved fluency of speech during casual conversation versus response to specific or complex questions. She also met criteria for adjustment disorder with anxiety.  RPR nonreactive, B12 1056, TSH 1.020.   On the morning of 03/14/2019, she got out of bed and started talking to the bathroom mirror, which is not new.  Her husband found her on the floor convulsing with facial twitching.  She was unresponsive.  She regained consciousness after about 10 minutes.  She was taken to [facility] ED for further evaluation.  Vitals were normal.  CT of head and cervical spine showed no acute abnormalities.  Labs, including CBC, troponin, electrolytes and troponin were unremarkable.  EKG was unremarkable.  She received a loading dose of Keppra and was discharged on 513m twice daily.  She has not had any recurrent seizure.   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   Past medication:  Lexapro 117m citalopram; Aricept (discontinued as it may lower seizure  threshold); memantine (no longer able to swallow, likely no longer efficacious)  Past Medical History: Past Medical History:  Diagnosis Date   [place]   [place]    Medications: Current Outpatient Medications on File Prior to Visit  Medication Sig Dispense Refill   alendronate (FOSAMAX) 70 MG tablet Take 70 mg by mouth once a week. Take with a full glass of water on an empty stomach.     CALCIUM PO Take 1 tablet by mouth daily.     escitalopram (LEXAPRO) 10 MG tablet Take 1 tablet (10 mg total) by mouth daily. 90 tablet 1   memantine (NAMENDA) 10 MG tablet TAKE 1 TABLET BY MOUTH  TWICE DAILY 180 tablet 3   Multiple Vitamins-Minerals (MULTIVITAMIN PO) Take 1 tablet by mouth daily.     Omega-3 Fatty Acids (FISH OIL PO) Take 1 capsule by mouth daily.     QUEtiapine (SEROQUEL) 25 MG tablet TAKE 2 TABLETS BY MOUTH TWICE  DAILY 120 tablet 11   [DISCONTINUED] citalopram (CELEXA) 20 MG tablet Take 1 tablet (20  mg total) by mouth daily. (Patient not taking: Reported on 01/22/2020) 30 tablet 3   [DISCONTINUED] donepezil (ARICEPT) 10 MG tablet TAKE 1 TABLET BY MOUTH AT  BEDTIME (Patient not taking: Reported on 01/22/2020) 90 tablet 3   No current facility-administered medications on file prior to visit.      Allergies: Allergies  Allergen Reactions   Sulfa Antibiotics     Family History: Family History  Problem Relation Age of Onset   Dementia Mother    Pancreatic cancer Father     Observations/Objective:    No acute distress.  Not oriented.  Unable to speak.   Follow Up Instructions:    -I discussed the assessment and treatment plan with the patient. The patient was provided an opportunity to ask questions and all were answered. The patient agreed with the plan and demonstrated an understanding of the instructions.   The patient was advised to call back or seek an in-person evaluation if the symptoms worsen or if the condition fails to improve as  anticipated.    Dudley Major, DO

## 2022-02-17 ENCOUNTER — Encounter: Payer: Self-pay | Admitting: Neurology

## 2022-02-17 ENCOUNTER — Other Ambulatory Visit: Payer: Self-pay | Admitting: Neurology

## 2022-02-17 ENCOUNTER — Telehealth (INDEPENDENT_AMBULATORY_CARE_PROVIDER_SITE_OTHER): Payer: Medicare Other | Admitting: Neurology

## 2022-02-17 DIAGNOSIS — F0280 Dementia in other diseases classified elsewhere without behavioral disturbance: Secondary | ICD-10-CM

## 2022-02-17 DIAGNOSIS — G40909 Epilepsy, unspecified, not intractable, without status epilepticus: Secondary | ICD-10-CM | POA: Diagnosis not present

## 2022-02-17 DIAGNOSIS — G3101 Pick's disease: Secondary | ICD-10-CM

## 2022-02-17 MED ORDER — LEVETIRACETAM 100 MG/ML PO SOLN: ORAL | 3 refills | Status: AC

## 2022-02-23 DIAGNOSIS — Z87440 Personal history of urinary (tract) infections: Secondary | ICD-10-CM | POA: Diagnosis not present

## 2022-02-23 DIAGNOSIS — F0280 Dementia in other diseases classified elsewhere without behavioral disturbance: Secondary | ICD-10-CM | POA: Diagnosis not present

## 2022-02-23 DIAGNOSIS — R1310 Dysphagia, unspecified: Secondary | ICD-10-CM | POA: Diagnosis not present

## 2022-02-23 DIAGNOSIS — G40909 Epilepsy, unspecified, not intractable, without status epilepticus: Secondary | ICD-10-CM | POA: Diagnosis not present

## 2022-02-23 DIAGNOSIS — G3101 Pick's disease: Secondary | ICD-10-CM | POA: Diagnosis not present

## 2022-02-27 DIAGNOSIS — R1310 Dysphagia, unspecified: Secondary | ICD-10-CM | POA: Diagnosis not present

## 2022-02-27 DIAGNOSIS — F0280 Dementia in other diseases classified elsewhere without behavioral disturbance: Secondary | ICD-10-CM | POA: Diagnosis not present

## 2022-02-27 DIAGNOSIS — G3101 Pick's disease: Secondary | ICD-10-CM | POA: Diagnosis not present

## 2022-02-27 DIAGNOSIS — G40909 Epilepsy, unspecified, not intractable, without status epilepticus: Secondary | ICD-10-CM | POA: Diagnosis not present

## 2022-02-27 DIAGNOSIS — Z87440 Personal history of urinary (tract) infections: Secondary | ICD-10-CM | POA: Diagnosis not present

## 2022-03-01 ENCOUNTER — Encounter: Payer: Self-pay | Admitting: Neurology

## 2022-03-07 DIAGNOSIS — R1310 Dysphagia, unspecified: Secondary | ICD-10-CM | POA: Diagnosis not present

## 2022-03-07 DIAGNOSIS — G3101 Pick's disease: Secondary | ICD-10-CM | POA: Diagnosis not present

## 2022-03-07 DIAGNOSIS — F0280 Dementia in other diseases classified elsewhere without behavioral disturbance: Secondary | ICD-10-CM | POA: Diagnosis not present

## 2022-03-07 DIAGNOSIS — Z87440 Personal history of urinary (tract) infections: Secondary | ICD-10-CM | POA: Diagnosis not present

## 2022-03-07 DIAGNOSIS — G40909 Epilepsy, unspecified, not intractable, without status epilepticus: Secondary | ICD-10-CM | POA: Diagnosis not present

## 2022-03-08 DIAGNOSIS — G40909 Epilepsy, unspecified, not intractable, without status epilepticus: Secondary | ICD-10-CM | POA: Diagnosis not present

## 2022-03-08 DIAGNOSIS — Z87440 Personal history of urinary (tract) infections: Secondary | ICD-10-CM | POA: Diagnosis not present

## 2022-03-08 DIAGNOSIS — F0280 Dementia in other diseases classified elsewhere without behavioral disturbance: Secondary | ICD-10-CM | POA: Diagnosis not present

## 2022-03-08 DIAGNOSIS — G3101 Pick's disease: Secondary | ICD-10-CM | POA: Diagnosis not present

## 2022-03-08 DIAGNOSIS — R1310 Dysphagia, unspecified: Secondary | ICD-10-CM | POA: Diagnosis not present

## 2022-03-14 DIAGNOSIS — R1310 Dysphagia, unspecified: Secondary | ICD-10-CM | POA: Diagnosis not present

## 2022-03-14 DIAGNOSIS — F0280 Dementia in other diseases classified elsewhere without behavioral disturbance: Secondary | ICD-10-CM | POA: Diagnosis not present

## 2022-03-14 DIAGNOSIS — Z87440 Personal history of urinary (tract) infections: Secondary | ICD-10-CM | POA: Diagnosis not present

## 2022-03-14 DIAGNOSIS — G3101 Pick's disease: Secondary | ICD-10-CM | POA: Diagnosis not present

## 2022-03-14 DIAGNOSIS — G40909 Epilepsy, unspecified, not intractable, without status epilepticus: Secondary | ICD-10-CM | POA: Diagnosis not present

## 2022-03-15 DIAGNOSIS — Z0279 Encounter for issue of other medical certificate: Secondary | ICD-10-CM

## 2022-03-15 DIAGNOSIS — F0280 Dementia in other diseases classified elsewhere without behavioral disturbance: Secondary | ICD-10-CM | POA: Diagnosis not present

## 2022-03-15 DIAGNOSIS — R1310 Dysphagia, unspecified: Secondary | ICD-10-CM | POA: Diagnosis not present

## 2022-03-15 DIAGNOSIS — Z87440 Personal history of urinary (tract) infections: Secondary | ICD-10-CM | POA: Diagnosis not present

## 2022-03-15 DIAGNOSIS — G3101 Pick's disease: Secondary | ICD-10-CM | POA: Diagnosis not present

## 2022-03-15 DIAGNOSIS — G40909 Epilepsy, unspecified, not intractable, without status epilepticus: Secondary | ICD-10-CM | POA: Diagnosis not present

## 2022-03-21 DIAGNOSIS — G3101 Pick's disease: Secondary | ICD-10-CM | POA: Diagnosis not present

## 2022-03-21 DIAGNOSIS — G40909 Epilepsy, unspecified, not intractable, without status epilepticus: Secondary | ICD-10-CM | POA: Diagnosis not present

## 2022-03-21 DIAGNOSIS — Z87440 Personal history of urinary (tract) infections: Secondary | ICD-10-CM | POA: Diagnosis not present

## 2022-03-21 DIAGNOSIS — F0280 Dementia in other diseases classified elsewhere without behavioral disturbance: Secondary | ICD-10-CM | POA: Diagnosis not present

## 2022-03-21 DIAGNOSIS — R1310 Dysphagia, unspecified: Secondary | ICD-10-CM | POA: Diagnosis not present

## 2022-03-28 DIAGNOSIS — F0280 Dementia in other diseases classified elsewhere without behavioral disturbance: Secondary | ICD-10-CM | POA: Diagnosis not present

## 2022-03-28 DIAGNOSIS — R1310 Dysphagia, unspecified: Secondary | ICD-10-CM | POA: Diagnosis not present

## 2022-03-28 DIAGNOSIS — G40909 Epilepsy, unspecified, not intractable, without status epilepticus: Secondary | ICD-10-CM | POA: Diagnosis not present

## 2022-03-28 DIAGNOSIS — Z87440 Personal history of urinary (tract) infections: Secondary | ICD-10-CM | POA: Diagnosis not present

## 2022-03-28 DIAGNOSIS — G3101 Pick's disease: Secondary | ICD-10-CM | POA: Diagnosis not present

## 2022-04-04 DIAGNOSIS — G40909 Epilepsy, unspecified, not intractable, without status epilepticus: Secondary | ICD-10-CM | POA: Diagnosis not present

## 2022-04-04 DIAGNOSIS — Z87440 Personal history of urinary (tract) infections: Secondary | ICD-10-CM | POA: Diagnosis not present

## 2022-04-04 DIAGNOSIS — G3101 Pick's disease: Secondary | ICD-10-CM | POA: Diagnosis not present

## 2022-04-04 DIAGNOSIS — R1310 Dysphagia, unspecified: Secondary | ICD-10-CM | POA: Diagnosis not present

## 2022-04-04 DIAGNOSIS — F0280 Dementia in other diseases classified elsewhere without behavioral disturbance: Secondary | ICD-10-CM | POA: Diagnosis not present

## 2022-04-07 DIAGNOSIS — R1310 Dysphagia, unspecified: Secondary | ICD-10-CM | POA: Diagnosis not present

## 2022-04-07 DIAGNOSIS — G40909 Epilepsy, unspecified, not intractable, without status epilepticus: Secondary | ICD-10-CM | POA: Diagnosis not present

## 2022-04-07 DIAGNOSIS — F0280 Dementia in other diseases classified elsewhere without behavioral disturbance: Secondary | ICD-10-CM | POA: Diagnosis not present

## 2022-04-07 DIAGNOSIS — G3101 Pick's disease: Secondary | ICD-10-CM | POA: Diagnosis not present

## 2022-04-07 DIAGNOSIS — Z87440 Personal history of urinary (tract) infections: Secondary | ICD-10-CM | POA: Diagnosis not present

## 2022-04-11 DIAGNOSIS — G40909 Epilepsy, unspecified, not intractable, without status epilepticus: Secondary | ICD-10-CM | POA: Diagnosis not present

## 2022-04-11 DIAGNOSIS — R1310 Dysphagia, unspecified: Secondary | ICD-10-CM | POA: Diagnosis not present

## 2022-04-11 DIAGNOSIS — Z87440 Personal history of urinary (tract) infections: Secondary | ICD-10-CM | POA: Diagnosis not present

## 2022-04-11 DIAGNOSIS — F0280 Dementia in other diseases classified elsewhere without behavioral disturbance: Secondary | ICD-10-CM | POA: Diagnosis not present

## 2022-04-11 DIAGNOSIS — G3101 Pick's disease: Secondary | ICD-10-CM | POA: Diagnosis not present

## 2022-04-18 DIAGNOSIS — Z87440 Personal history of urinary (tract) infections: Secondary | ICD-10-CM | POA: Diagnosis not present

## 2022-04-18 DIAGNOSIS — G3101 Pick's disease: Secondary | ICD-10-CM | POA: Diagnosis not present

## 2022-04-18 DIAGNOSIS — G40909 Epilepsy, unspecified, not intractable, without status epilepticus: Secondary | ICD-10-CM | POA: Diagnosis not present

## 2022-04-18 DIAGNOSIS — F0280 Dementia in other diseases classified elsewhere without behavioral disturbance: Secondary | ICD-10-CM | POA: Diagnosis not present

## 2022-04-18 DIAGNOSIS — R1310 Dysphagia, unspecified: Secondary | ICD-10-CM | POA: Diagnosis not present

## 2022-04-19 ENCOUNTER — Telehealth: Payer: Self-pay | Admitting: Anesthesiology

## 2022-04-19 DIAGNOSIS — F0280 Dementia in other diseases classified elsewhere without behavioral disturbance: Secondary | ICD-10-CM | POA: Diagnosis not present

## 2022-04-19 DIAGNOSIS — G40909 Epilepsy, unspecified, not intractable, without status epilepticus: Secondary | ICD-10-CM | POA: Diagnosis not present

## 2022-04-19 DIAGNOSIS — G3101 Pick's disease: Secondary | ICD-10-CM | POA: Diagnosis not present

## 2022-04-19 DIAGNOSIS — R1310 Dysphagia, unspecified: Secondary | ICD-10-CM | POA: Diagnosis not present

## 2022-04-19 DIAGNOSIS — Z87440 Personal history of urinary (tract) infections: Secondary | ICD-10-CM | POA: Diagnosis not present

## 2022-04-19 NOTE — Telephone Encounter (Signed)
Patient's husband is calling in stating that he just wants to know why he hasn't received a copy of the paperwork, was told that we were going to mail them out. Husband states that no one from his household called, that it may have been the insurance company that called as Kerry is unable to make a phone call.

## 2022-04-19 NOTE — Telephone Encounter (Signed)
Pt left message after hours with AN stating that Dr Tomi Likens filled out an attending physician statement on 04/12/2022 and on page 3 did not check off part E on the form and this needs to be completed and faxed to [phone].

## 2022-04-19 NOTE — Telephone Encounter (Signed)
Forms updated and faxed back to [employer].   Forms mailed to patient husband.

## 2022-04-20 ENCOUNTER — Encounter: Payer: Self-pay | Admitting: Neurology

## 2022-04-25 DIAGNOSIS — G40909 Epilepsy, unspecified, not intractable, without status epilepticus: Secondary | ICD-10-CM | POA: Diagnosis not present

## 2022-04-25 DIAGNOSIS — F0280 Dementia in other diseases classified elsewhere without behavioral disturbance: Secondary | ICD-10-CM | POA: Diagnosis not present

## 2022-04-25 DIAGNOSIS — Z87440 Personal history of urinary (tract) infections: Secondary | ICD-10-CM | POA: Diagnosis not present

## 2022-04-25 DIAGNOSIS — G3101 Pick's disease: Secondary | ICD-10-CM | POA: Diagnosis not present

## 2022-04-25 DIAGNOSIS — R1310 Dysphagia, unspecified: Secondary | ICD-10-CM | POA: Diagnosis not present

## 2022-05-02 DIAGNOSIS — F0280 Dementia in other diseases classified elsewhere without behavioral disturbance: Secondary | ICD-10-CM | POA: Diagnosis not present

## 2022-05-02 DIAGNOSIS — G40909 Epilepsy, unspecified, not intractable, without status epilepticus: Secondary | ICD-10-CM | POA: Diagnosis not present

## 2022-05-02 DIAGNOSIS — R1310 Dysphagia, unspecified: Secondary | ICD-10-CM | POA: Diagnosis not present

## 2022-05-02 DIAGNOSIS — Z87440 Personal history of urinary (tract) infections: Secondary | ICD-10-CM | POA: Diagnosis not present

## 2022-05-02 DIAGNOSIS — G3101 Pick's disease: Secondary | ICD-10-CM | POA: Diagnosis not present

## 2022-05-08 DIAGNOSIS — F0280 Dementia in other diseases classified elsewhere without behavioral disturbance: Secondary | ICD-10-CM | POA: Diagnosis not present

## 2022-05-08 DIAGNOSIS — R1310 Dysphagia, unspecified: Secondary | ICD-10-CM | POA: Diagnosis not present

## 2022-05-08 DIAGNOSIS — Z87440 Personal history of urinary (tract) infections: Secondary | ICD-10-CM | POA: Diagnosis not present

## 2022-05-08 DIAGNOSIS — G3101 Pick's disease: Secondary | ICD-10-CM | POA: Diagnosis not present

## 2022-05-08 DIAGNOSIS — G40909 Epilepsy, unspecified, not intractable, without status epilepticus: Secondary | ICD-10-CM | POA: Diagnosis not present

## 2022-05-09 DIAGNOSIS — Z87440 Personal history of urinary (tract) infections: Secondary | ICD-10-CM | POA: Diagnosis not present

## 2022-05-09 DIAGNOSIS — G3101 Pick's disease: Secondary | ICD-10-CM | POA: Diagnosis not present

## 2022-05-09 DIAGNOSIS — G40909 Epilepsy, unspecified, not intractable, without status epilepticus: Secondary | ICD-10-CM | POA: Diagnosis not present

## 2022-05-09 DIAGNOSIS — R1310 Dysphagia, unspecified: Secondary | ICD-10-CM | POA: Diagnosis not present

## 2022-05-09 DIAGNOSIS — F0280 Dementia in other diseases classified elsewhere without behavioral disturbance: Secondary | ICD-10-CM | POA: Diagnosis not present

## 2022-05-16 DIAGNOSIS — G3101 Pick's disease: Secondary | ICD-10-CM | POA: Diagnosis not present

## 2022-05-16 DIAGNOSIS — Z87440 Personal history of urinary (tract) infections: Secondary | ICD-10-CM | POA: Diagnosis not present

## 2022-05-16 DIAGNOSIS — G40909 Epilepsy, unspecified, not intractable, without status epilepticus: Secondary | ICD-10-CM | POA: Diagnosis not present

## 2022-05-16 DIAGNOSIS — F0280 Dementia in other diseases classified elsewhere without behavioral disturbance: Secondary | ICD-10-CM | POA: Diagnosis not present

## 2022-05-16 DIAGNOSIS — R1310 Dysphagia, unspecified: Secondary | ICD-10-CM | POA: Diagnosis not present

## 2022-05-23 DIAGNOSIS — F0280 Dementia in other diseases classified elsewhere without behavioral disturbance: Secondary | ICD-10-CM | POA: Diagnosis not present

## 2022-05-23 DIAGNOSIS — G3101 Pick's disease: Secondary | ICD-10-CM | POA: Diagnosis not present

## 2022-05-23 DIAGNOSIS — Z87440 Personal history of urinary (tract) infections: Secondary | ICD-10-CM | POA: Diagnosis not present

## 2022-05-23 DIAGNOSIS — R1310 Dysphagia, unspecified: Secondary | ICD-10-CM | POA: Diagnosis not present

## 2022-05-23 DIAGNOSIS — G40909 Epilepsy, unspecified, not intractable, without status epilepticus: Secondary | ICD-10-CM | POA: Diagnosis not present

## 2022-05-29 DIAGNOSIS — Z87440 Personal history of urinary (tract) infections: Secondary | ICD-10-CM | POA: Diagnosis not present

## 2022-05-29 DIAGNOSIS — F0280 Dementia in other diseases classified elsewhere without behavioral disturbance: Secondary | ICD-10-CM | POA: Diagnosis not present

## 2022-05-29 DIAGNOSIS — R1310 Dysphagia, unspecified: Secondary | ICD-10-CM | POA: Diagnosis not present

## 2022-05-29 DIAGNOSIS — G3101 Pick's disease: Secondary | ICD-10-CM | POA: Diagnosis not present

## 2022-05-29 DIAGNOSIS — G40909 Epilepsy, unspecified, not intractable, without status epilepticus: Secondary | ICD-10-CM | POA: Diagnosis not present

## 2022-05-30 DIAGNOSIS — F0280 Dementia in other diseases classified elsewhere without behavioral disturbance: Secondary | ICD-10-CM | POA: Diagnosis not present

## 2022-05-30 DIAGNOSIS — R1310 Dysphagia, unspecified: Secondary | ICD-10-CM | POA: Diagnosis not present

## 2022-05-30 DIAGNOSIS — G3101 Pick's disease: Secondary | ICD-10-CM | POA: Diagnosis not present

## 2022-05-30 DIAGNOSIS — Z87440 Personal history of urinary (tract) infections: Secondary | ICD-10-CM | POA: Diagnosis not present

## 2022-05-30 DIAGNOSIS — G40909 Epilepsy, unspecified, not intractable, without status epilepticus: Secondary | ICD-10-CM | POA: Diagnosis not present

## 2022-06-06 DIAGNOSIS — F0280 Dementia in other diseases classified elsewhere without behavioral disturbance: Secondary | ICD-10-CM | POA: Diagnosis not present

## 2022-06-06 DIAGNOSIS — G40909 Epilepsy, unspecified, not intractable, without status epilepticus: Secondary | ICD-10-CM | POA: Diagnosis not present

## 2022-06-06 DIAGNOSIS — Z87440 Personal history of urinary (tract) infections: Secondary | ICD-10-CM | POA: Diagnosis not present

## 2022-06-06 DIAGNOSIS — R1310 Dysphagia, unspecified: Secondary | ICD-10-CM | POA: Diagnosis not present

## 2022-06-06 DIAGNOSIS — G3101 Pick's disease: Secondary | ICD-10-CM | POA: Diagnosis not present

## 2022-06-08 DIAGNOSIS — R159 Full incontinence of feces: Secondary | ICD-10-CM | POA: Diagnosis not present

## 2022-06-08 DIAGNOSIS — R32 Unspecified urinary incontinence: Secondary | ICD-10-CM | POA: Diagnosis not present

## 2022-06-08 DIAGNOSIS — Z87440 Personal history of urinary (tract) infections: Secondary | ICD-10-CM | POA: Diagnosis not present

## 2022-06-08 DIAGNOSIS — R1310 Dysphagia, unspecified: Secondary | ICD-10-CM | POA: Diagnosis not present

## 2022-06-08 DIAGNOSIS — G3101 Pick's disease: Secondary | ICD-10-CM | POA: Diagnosis not present

## 2022-06-08 DIAGNOSIS — Z741 Need for assistance with personal care: Secondary | ICD-10-CM | POA: Diagnosis not present

## 2022-06-08 DIAGNOSIS — F0280 Dementia in other diseases classified elsewhere without behavioral disturbance: Secondary | ICD-10-CM | POA: Diagnosis not present

## 2022-06-08 DIAGNOSIS — G40909 Epilepsy, unspecified, not intractable, without status epilepticus: Secondary | ICD-10-CM | POA: Diagnosis not present

## 2022-06-13 DIAGNOSIS — G40909 Epilepsy, unspecified, not intractable, without status epilepticus: Secondary | ICD-10-CM | POA: Diagnosis not present

## 2022-06-13 DIAGNOSIS — R1310 Dysphagia, unspecified: Secondary | ICD-10-CM | POA: Diagnosis not present

## 2022-06-13 DIAGNOSIS — G3101 Pick's disease: Secondary | ICD-10-CM | POA: Diagnosis not present

## 2022-06-13 DIAGNOSIS — Z87440 Personal history of urinary (tract) infections: Secondary | ICD-10-CM | POA: Diagnosis not present

## 2022-06-13 DIAGNOSIS — R32 Unspecified urinary incontinence: Secondary | ICD-10-CM | POA: Diagnosis not present

## 2022-06-13 DIAGNOSIS — F0280 Dementia in other diseases classified elsewhere without behavioral disturbance: Secondary | ICD-10-CM | POA: Diagnosis not present

## 2022-06-20 DIAGNOSIS — R1310 Dysphagia, unspecified: Secondary | ICD-10-CM | POA: Diagnosis not present

## 2022-06-20 DIAGNOSIS — Z87440 Personal history of urinary (tract) infections: Secondary | ICD-10-CM | POA: Diagnosis not present

## 2022-06-20 DIAGNOSIS — G3101 Pick's disease: Secondary | ICD-10-CM | POA: Diagnosis not present

## 2022-06-20 DIAGNOSIS — G40909 Epilepsy, unspecified, not intractable, without status epilepticus: Secondary | ICD-10-CM | POA: Diagnosis not present

## 2022-06-20 DIAGNOSIS — R32 Unspecified urinary incontinence: Secondary | ICD-10-CM | POA: Diagnosis not present

## 2022-06-20 DIAGNOSIS — F0280 Dementia in other diseases classified elsewhere without behavioral disturbance: Secondary | ICD-10-CM | POA: Diagnosis not present

## 2022-06-27 DIAGNOSIS — G40909 Epilepsy, unspecified, not intractable, without status epilepticus: Secondary | ICD-10-CM | POA: Diagnosis not present

## 2022-06-27 DIAGNOSIS — F0280 Dementia in other diseases classified elsewhere without behavioral disturbance: Secondary | ICD-10-CM | POA: Diagnosis not present

## 2022-06-27 DIAGNOSIS — R1310 Dysphagia, unspecified: Secondary | ICD-10-CM | POA: Diagnosis not present

## 2022-06-27 DIAGNOSIS — G3101 Pick's disease: Secondary | ICD-10-CM | POA: Diagnosis not present

## 2022-06-27 DIAGNOSIS — Z87440 Personal history of urinary (tract) infections: Secondary | ICD-10-CM | POA: Diagnosis not present

## 2022-06-27 DIAGNOSIS — R32 Unspecified urinary incontinence: Secondary | ICD-10-CM | POA: Diagnosis not present

## 2022-06-29 DIAGNOSIS — Z87440 Personal history of urinary (tract) infections: Secondary | ICD-10-CM | POA: Diagnosis not present

## 2022-06-29 DIAGNOSIS — G40909 Epilepsy, unspecified, not intractable, without status epilepticus: Secondary | ICD-10-CM | POA: Diagnosis not present

## 2022-06-29 DIAGNOSIS — G3101 Pick's disease: Secondary | ICD-10-CM | POA: Diagnosis not present

## 2022-06-29 DIAGNOSIS — R32 Unspecified urinary incontinence: Secondary | ICD-10-CM | POA: Diagnosis not present

## 2022-06-29 DIAGNOSIS — R1310 Dysphagia, unspecified: Secondary | ICD-10-CM | POA: Diagnosis not present

## 2022-06-29 DIAGNOSIS — F0280 Dementia in other diseases classified elsewhere without behavioral disturbance: Secondary | ICD-10-CM | POA: Diagnosis not present

## 2022-07-04 DIAGNOSIS — G40909 Epilepsy, unspecified, not intractable, without status epilepticus: Secondary | ICD-10-CM | POA: Diagnosis not present

## 2022-07-04 DIAGNOSIS — R32 Unspecified urinary incontinence: Secondary | ICD-10-CM | POA: Diagnosis not present

## 2022-07-04 DIAGNOSIS — G3101 Pick's disease: Secondary | ICD-10-CM | POA: Diagnosis not present

## 2022-07-04 DIAGNOSIS — R1310 Dysphagia, unspecified: Secondary | ICD-10-CM | POA: Diagnosis not present

## 2022-07-04 DIAGNOSIS — Z87440 Personal history of urinary (tract) infections: Secondary | ICD-10-CM | POA: Diagnosis not present

## 2022-07-04 DIAGNOSIS — F0280 Dementia in other diseases classified elsewhere without behavioral disturbance: Secondary | ICD-10-CM | POA: Diagnosis not present

## 2022-07-07 DIAGNOSIS — F0280 Dementia in other diseases classified elsewhere without behavioral disturbance: Secondary | ICD-10-CM | POA: Diagnosis not present

## 2022-07-07 DIAGNOSIS — G3101 Pick's disease: Secondary | ICD-10-CM | POA: Diagnosis not present

## 2022-07-07 DIAGNOSIS — Z741 Need for assistance with personal care: Secondary | ICD-10-CM | POA: Diagnosis not present

## 2022-07-07 DIAGNOSIS — R32 Unspecified urinary incontinence: Secondary | ICD-10-CM | POA: Diagnosis not present

## 2022-07-07 DIAGNOSIS — G40909 Epilepsy, unspecified, not intractable, without status epilepticus: Secondary | ICD-10-CM | POA: Diagnosis not present

## 2022-07-07 DIAGNOSIS — R1310 Dysphagia, unspecified: Secondary | ICD-10-CM | POA: Diagnosis not present

## 2022-07-07 DIAGNOSIS — Z87440 Personal history of urinary (tract) infections: Secondary | ICD-10-CM | POA: Diagnosis not present

## 2022-07-07 DIAGNOSIS — R159 Full incontinence of feces: Secondary | ICD-10-CM | POA: Diagnosis not present

## 2022-07-11 DIAGNOSIS — F0280 Dementia in other diseases classified elsewhere without behavioral disturbance: Secondary | ICD-10-CM | POA: Diagnosis not present

## 2022-07-11 DIAGNOSIS — Z87440 Personal history of urinary (tract) infections: Secondary | ICD-10-CM | POA: Diagnosis not present

## 2022-07-11 DIAGNOSIS — G40909 Epilepsy, unspecified, not intractable, without status epilepticus: Secondary | ICD-10-CM | POA: Diagnosis not present

## 2022-07-11 DIAGNOSIS — R32 Unspecified urinary incontinence: Secondary | ICD-10-CM | POA: Diagnosis not present

## 2022-07-11 DIAGNOSIS — R1310 Dysphagia, unspecified: Secondary | ICD-10-CM | POA: Diagnosis not present

## 2022-07-11 DIAGNOSIS — G3101 Pick's disease: Secondary | ICD-10-CM | POA: Diagnosis not present

## 2022-07-14 DIAGNOSIS — G40909 Epilepsy, unspecified, not intractable, without status epilepticus: Secondary | ICD-10-CM | POA: Diagnosis not present

## 2022-07-14 DIAGNOSIS — R32 Unspecified urinary incontinence: Secondary | ICD-10-CM | POA: Diagnosis not present

## 2022-07-14 DIAGNOSIS — G3101 Pick's disease: Secondary | ICD-10-CM | POA: Diagnosis not present

## 2022-07-14 DIAGNOSIS — F0280 Dementia in other diseases classified elsewhere without behavioral disturbance: Secondary | ICD-10-CM | POA: Diagnosis not present

## 2022-07-14 DIAGNOSIS — Z87440 Personal history of urinary (tract) infections: Secondary | ICD-10-CM | POA: Diagnosis not present

## 2022-07-14 DIAGNOSIS — R1310 Dysphagia, unspecified: Secondary | ICD-10-CM | POA: Diagnosis not present

## 2022-07-17 DIAGNOSIS — G40909 Epilepsy, unspecified, not intractable, without status epilepticus: Secondary | ICD-10-CM | POA: Diagnosis not present

## 2022-07-17 DIAGNOSIS — R32 Unspecified urinary incontinence: Secondary | ICD-10-CM | POA: Diagnosis not present

## 2022-07-17 DIAGNOSIS — Z87440 Personal history of urinary (tract) infections: Secondary | ICD-10-CM | POA: Diagnosis not present

## 2022-07-17 DIAGNOSIS — R1310 Dysphagia, unspecified: Secondary | ICD-10-CM | POA: Diagnosis not present

## 2022-07-17 DIAGNOSIS — F0280 Dementia in other diseases classified elsewhere without behavioral disturbance: Secondary | ICD-10-CM | POA: Diagnosis not present

## 2022-07-17 DIAGNOSIS — G3101 Pick's disease: Secondary | ICD-10-CM | POA: Diagnosis not present

## 2022-07-18 DIAGNOSIS — R1310 Dysphagia, unspecified: Secondary | ICD-10-CM | POA: Diagnosis not present

## 2022-07-18 DIAGNOSIS — G40909 Epilepsy, unspecified, not intractable, without status epilepticus: Secondary | ICD-10-CM | POA: Diagnosis not present

## 2022-07-18 DIAGNOSIS — F0280 Dementia in other diseases classified elsewhere without behavioral disturbance: Secondary | ICD-10-CM | POA: Diagnosis not present

## 2022-07-18 DIAGNOSIS — R32 Unspecified urinary incontinence: Secondary | ICD-10-CM | POA: Diagnosis not present

## 2022-07-18 DIAGNOSIS — Z87440 Personal history of urinary (tract) infections: Secondary | ICD-10-CM | POA: Diagnosis not present

## 2022-07-18 DIAGNOSIS — G3101 Pick's disease: Secondary | ICD-10-CM | POA: Diagnosis not present

## 2022-07-25 DIAGNOSIS — G40909 Epilepsy, unspecified, not intractable, without status epilepticus: Secondary | ICD-10-CM | POA: Diagnosis not present

## 2022-07-25 DIAGNOSIS — Z87440 Personal history of urinary (tract) infections: Secondary | ICD-10-CM | POA: Diagnosis not present

## 2022-07-25 DIAGNOSIS — R1310 Dysphagia, unspecified: Secondary | ICD-10-CM | POA: Diagnosis not present

## 2022-07-25 DIAGNOSIS — R32 Unspecified urinary incontinence: Secondary | ICD-10-CM | POA: Diagnosis not present

## 2022-07-25 DIAGNOSIS — G3101 Pick's disease: Secondary | ICD-10-CM | POA: Diagnosis not present

## 2022-07-25 DIAGNOSIS — F0280 Dementia in other diseases classified elsewhere without behavioral disturbance: Secondary | ICD-10-CM | POA: Diagnosis not present

## 2022-08-01 DIAGNOSIS — G40909 Epilepsy, unspecified, not intractable, without status epilepticus: Secondary | ICD-10-CM | POA: Diagnosis not present

## 2022-08-01 DIAGNOSIS — Z87440 Personal history of urinary (tract) infections: Secondary | ICD-10-CM | POA: Diagnosis not present

## 2022-08-01 DIAGNOSIS — G3101 Pick's disease: Secondary | ICD-10-CM | POA: Diagnosis not present

## 2022-08-01 DIAGNOSIS — F0280 Dementia in other diseases classified elsewhere without behavioral disturbance: Secondary | ICD-10-CM | POA: Diagnosis not present

## 2022-08-01 DIAGNOSIS — R1310 Dysphagia, unspecified: Secondary | ICD-10-CM | POA: Diagnosis not present

## 2022-08-01 DIAGNOSIS — R32 Unspecified urinary incontinence: Secondary | ICD-10-CM | POA: Diagnosis not present

## 2022-08-07 DIAGNOSIS — Z87440 Personal history of urinary (tract) infections: Secondary | ICD-10-CM | POA: Diagnosis not present

## 2022-08-07 DIAGNOSIS — Z741 Need for assistance with personal care: Secondary | ICD-10-CM | POA: Diagnosis not present

## 2022-08-07 DIAGNOSIS — G3101 Pick's disease: Secondary | ICD-10-CM | POA: Diagnosis not present

## 2022-08-07 DIAGNOSIS — R32 Unspecified urinary incontinence: Secondary | ICD-10-CM | POA: Diagnosis not present

## 2022-08-07 DIAGNOSIS — G40909 Epilepsy, unspecified, not intractable, without status epilepticus: Secondary | ICD-10-CM | POA: Diagnosis not present

## 2022-08-07 DIAGNOSIS — R159 Full incontinence of feces: Secondary | ICD-10-CM | POA: Diagnosis not present

## 2022-08-07 DIAGNOSIS — R1310 Dysphagia, unspecified: Secondary | ICD-10-CM | POA: Diagnosis not present

## 2022-08-07 DIAGNOSIS — F0280 Dementia in other diseases classified elsewhere without behavioral disturbance: Secondary | ICD-10-CM | POA: Diagnosis not present

## 2022-08-08 DIAGNOSIS — F0280 Dementia in other diseases classified elsewhere without behavioral disturbance: Secondary | ICD-10-CM | POA: Diagnosis not present

## 2022-08-08 DIAGNOSIS — G3101 Pick's disease: Secondary | ICD-10-CM | POA: Diagnosis not present

## 2022-08-08 DIAGNOSIS — Z87440 Personal history of urinary (tract) infections: Secondary | ICD-10-CM | POA: Diagnosis not present

## 2022-08-08 DIAGNOSIS — R1310 Dysphagia, unspecified: Secondary | ICD-10-CM | POA: Diagnosis not present

## 2022-08-08 DIAGNOSIS — G40909 Epilepsy, unspecified, not intractable, without status epilepticus: Secondary | ICD-10-CM | POA: Diagnosis not present

## 2022-08-08 DIAGNOSIS — R32 Unspecified urinary incontinence: Secondary | ICD-10-CM | POA: Diagnosis not present

## 2022-08-15 DIAGNOSIS — Z87440 Personal history of urinary (tract) infections: Secondary | ICD-10-CM | POA: Diagnosis not present

## 2022-08-15 DIAGNOSIS — G3101 Pick's disease: Secondary | ICD-10-CM | POA: Diagnosis not present

## 2022-08-15 DIAGNOSIS — F0280 Dementia in other diseases classified elsewhere without behavioral disturbance: Secondary | ICD-10-CM | POA: Diagnosis not present

## 2022-08-15 DIAGNOSIS — R1310 Dysphagia, unspecified: Secondary | ICD-10-CM | POA: Diagnosis not present

## 2022-08-15 DIAGNOSIS — R32 Unspecified urinary incontinence: Secondary | ICD-10-CM | POA: Diagnosis not present

## 2022-08-15 DIAGNOSIS — G40909 Epilepsy, unspecified, not intractable, without status epilepticus: Secondary | ICD-10-CM | POA: Diagnosis not present

## 2022-08-16 ENCOUNTER — Encounter: Payer: Self-pay | Admitting: Neurology

## 2022-08-22 ENCOUNTER — Encounter: Payer: Self-pay | Admitting: Neurology

## 2022-08-22 ENCOUNTER — Telehealth (INDEPENDENT_AMBULATORY_CARE_PROVIDER_SITE_OTHER): Payer: Medicare Other | Admitting: Neurology

## 2022-08-22 DIAGNOSIS — F0280 Dementia in other diseases classified elsewhere without behavioral disturbance: Secondary | ICD-10-CM

## 2022-08-22 DIAGNOSIS — R1310 Dysphagia, unspecified: Secondary | ICD-10-CM | POA: Diagnosis not present

## 2022-08-22 DIAGNOSIS — G3101 Pick's disease: Secondary | ICD-10-CM

## 2022-08-22 DIAGNOSIS — Z87440 Personal history of urinary (tract) infections: Secondary | ICD-10-CM | POA: Diagnosis not present

## 2022-08-22 DIAGNOSIS — Z87898 Personal history of other specified conditions: Secondary | ICD-10-CM | POA: Diagnosis not present

## 2022-08-22 DIAGNOSIS — R32 Unspecified urinary incontinence: Secondary | ICD-10-CM | POA: Diagnosis not present

## 2022-08-22 DIAGNOSIS — G40909 Epilepsy, unspecified, not intractable, without status epilepticus: Secondary | ICD-10-CM | POA: Diagnosis not present

## 2022-08-22 NOTE — Progress Notes (Signed)
Due to inability to connect for video virtual visit, this telephone visit was done via telephone from my office and it was initiated and consent given by this patient and or family.  Telephone (Audio) Visit The purpose of this telephone visit is to provide medical care while limiting exposure to the novel coronavirus.    Consent was obtained for telephone visit and initiated by pt/family:  Yes.   Answered questions that patient had about telehealth interaction:  Yes.   I discussed the limitations, risks, security and privacy concerns of performing an evaluation and management service by telephone. I also discussed with the patient that there may be a patient responsible charge related to this service. The patient expressed understanding and agreed to proceed.  Pt location: Home Physician Location: office Name of referring provider:  [name], MD I connected with .Rashaunda Rahl Brinkman at patients initiation/request on 08/22/2022 at  2:30 PM EDT by telephone and verified that I am speaking with the correct person using two identifiers.  Pt MRN:  [id number] Pt DOB:  1951/04/18  Assessment/Plan:   Primary progressive aphasia History of seizure   1  Seizure prophylaxis: Keppra  in morning and  at night.  2.  For agitation:  quetiapine dose  in afternoon and  at night  3.  24 hour supervision 4.  Follow up in 6 months.  Need for in-office visit:  No     Subjective:    Michele Porter is a 72 year old right-handed woman with logopenic primary progressive aphasia and dementia who follows up for primary progressive aphasia.  I spoke with patient's husband, as Mrs. How is a poor historian.  ED note from May reviewed.   UPDATE: Current medications: Seroquel  at lunch and  at dinner;  Keppra  (5 mL) in morning and  (10 mL) at dinner   Last seizure:  09/29/2021.   Overall, she has been stable since October.  Michele Porter continues to live at home with her  husband who is her primary caregiver with the assistance of CNAs through [employer].  She was admitted into home hospice care program through [facility] [place] on 10/19.  She has an Charity fundraiser from hospice visiting every week.  She is now wheelchair-bound, unable to stand or walk.  She also has a lift chair and hospital bed.  She sleeps at night uninterrupted from 7:30 PM to 8:30 AM.  Naps for 30 minutes after lunch.  Quetiapine helps her fall asleep at night.  The lower dose in the afternoon is effective and does not cause the same drowsiness as the higher dose.     [place]     HISTORY: Her husband began noticing problems with speech and language in 2015-2016, which has progressed.  At first, she would mispronounce words or phrases.  She then had difficulty carrying on a conversation as well as difficulty with reading.  MRI of brain with and without contrast from 04/16/15 showed mild cerebral atrophy but no mass lesions or acute intracranial process.  She then had trouble with numbers and trouble correctly punching the buttons on the microwave.  When she writes a check, she is able to write in the numbers but struggles spelling out the numbers.  She underwent neuropsychological testing on 07/12/15.  She demonstrated deficits of expressive language, repetition, confrontation naming, verbal fluency and comprehension, consistent with logopenic variant primary progressive aphasia.  She exhibited frequent phonemic paraphasias, circumlocution and slightly improved fluency of speech during casual conversation versus  response to specific or complex questions. She also met criteria for adjustment disorder with anxiety.  RPR nonreactive, B12 1056, TSH 1.020.   On the morning of 03/14/2019, she got out of bed and started talking to the bathroom mirror, which is not new.  Her husband found her on the floor convulsing with facial twitching.  She was unresponsive.  She regained consciousness after  about 10 minutes.  She was taken to [facility] ED for further evaluation.  Vitals were normal.  CT of head and cervical spine showed no acute abnormalities.  Labs, including CBC, troponin, electrolytes and troponin were unremarkable.  EKG was unremarkable.  She received a loading dose of Keppra and was discharged on  twice daily.  She has not had any recurrent seizure.   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  Her mother was diagnosed with dementia late in life and passed away in her 34s.  Her maternal aunt and cousin also had dementia.   Past medication:  Lexapro ; citalopram; Aricept (discontinued as it may lower seizure threshold); memantine (no longer able to swallow, likely no longer efficacious) Objective:   There were no vitals filed for this visit.   Follow Up Instructions:      -I discussed the assessment and treatment plan with the patient. The patient was provided an opportunity to ask questions and all were answered. The patient agreed with the plan and demonstrated an understanding of the instructions.   The patient was advised to call back or seek an in-person evaluation if the symptoms worsen or if the condition fails to improve as anticipated.    Total Time spent in visit with the patient was:  16 minutes, of which 100% of the time was spent in counseling and/or coordinating care.   Patient's husband understands and agrees with the plan of care outlined.     Michele Servant, DO

## 2022-08-29 DIAGNOSIS — Z87440 Personal history of urinary (tract) infections: Secondary | ICD-10-CM | POA: Diagnosis not present

## 2022-08-29 DIAGNOSIS — R32 Unspecified urinary incontinence: Secondary | ICD-10-CM | POA: Diagnosis not present

## 2022-08-29 DIAGNOSIS — G3101 Pick's disease: Secondary | ICD-10-CM | POA: Diagnosis not present

## 2022-08-29 DIAGNOSIS — R1310 Dysphagia, unspecified: Secondary | ICD-10-CM | POA: Diagnosis not present

## 2022-08-29 DIAGNOSIS — F0280 Dementia in other diseases classified elsewhere without behavioral disturbance: Secondary | ICD-10-CM | POA: Diagnosis not present

## 2022-08-29 DIAGNOSIS — G40909 Epilepsy, unspecified, not intractable, without status epilepticus: Secondary | ICD-10-CM | POA: Diagnosis not present

## 2022-08-31 DIAGNOSIS — G40909 Epilepsy, unspecified, not intractable, without status epilepticus: Secondary | ICD-10-CM | POA: Diagnosis not present

## 2022-08-31 DIAGNOSIS — R1310 Dysphagia, unspecified: Secondary | ICD-10-CM | POA: Diagnosis not present

## 2022-08-31 DIAGNOSIS — F0280 Dementia in other diseases classified elsewhere without behavioral disturbance: Secondary | ICD-10-CM | POA: Diagnosis not present

## 2022-08-31 DIAGNOSIS — Z87440 Personal history of urinary (tract) infections: Secondary | ICD-10-CM | POA: Diagnosis not present

## 2022-08-31 DIAGNOSIS — R32 Unspecified urinary incontinence: Secondary | ICD-10-CM | POA: Diagnosis not present

## 2022-08-31 DIAGNOSIS — G3101 Pick's disease: Secondary | ICD-10-CM | POA: Diagnosis not present

## 2022-09-05 DIAGNOSIS — R1310 Dysphagia, unspecified: Secondary | ICD-10-CM | POA: Diagnosis not present

## 2022-09-05 DIAGNOSIS — G3101 Pick's disease: Secondary | ICD-10-CM | POA: Diagnosis not present

## 2022-09-05 DIAGNOSIS — R32 Unspecified urinary incontinence: Secondary | ICD-10-CM | POA: Diagnosis not present

## 2022-09-05 DIAGNOSIS — Z87440 Personal history of urinary (tract) infections: Secondary | ICD-10-CM | POA: Diagnosis not present

## 2022-09-05 DIAGNOSIS — G40909 Epilepsy, unspecified, not intractable, without status epilepticus: Secondary | ICD-10-CM | POA: Diagnosis not present

## 2022-09-05 DIAGNOSIS — F0280 Dementia in other diseases classified elsewhere without behavioral disturbance: Secondary | ICD-10-CM | POA: Diagnosis not present

## 2022-09-06 DIAGNOSIS — R32 Unspecified urinary incontinence: Secondary | ICD-10-CM | POA: Diagnosis not present

## 2022-09-06 DIAGNOSIS — R1310 Dysphagia, unspecified: Secondary | ICD-10-CM | POA: Diagnosis not present

## 2022-09-06 DIAGNOSIS — Z87440 Personal history of urinary (tract) infections: Secondary | ICD-10-CM | POA: Diagnosis not present

## 2022-09-06 DIAGNOSIS — G40909 Epilepsy, unspecified, not intractable, without status epilepticus: Secondary | ICD-10-CM | POA: Diagnosis not present

## 2022-09-06 DIAGNOSIS — Z741 Need for assistance with personal care: Secondary | ICD-10-CM | POA: Diagnosis not present

## 2022-09-06 DIAGNOSIS — R159 Full incontinence of feces: Secondary | ICD-10-CM | POA: Diagnosis not present

## 2022-09-06 DIAGNOSIS — G3101 Pick's disease: Secondary | ICD-10-CM | POA: Diagnosis not present

## 2022-09-06 DIAGNOSIS — F0280 Dementia in other diseases classified elsewhere without behavioral disturbance: Secondary | ICD-10-CM | POA: Diagnosis not present

## 2022-09-12 DIAGNOSIS — Z87440 Personal history of urinary (tract) infections: Secondary | ICD-10-CM | POA: Diagnosis not present

## 2022-09-12 DIAGNOSIS — G40909 Epilepsy, unspecified, not intractable, without status epilepticus: Secondary | ICD-10-CM | POA: Diagnosis not present

## 2022-09-12 DIAGNOSIS — R1310 Dysphagia, unspecified: Secondary | ICD-10-CM | POA: Diagnosis not present

## 2022-09-12 DIAGNOSIS — F0280 Dementia in other diseases classified elsewhere without behavioral disturbance: Secondary | ICD-10-CM | POA: Diagnosis not present

## 2022-09-12 DIAGNOSIS — G3101 Pick's disease: Secondary | ICD-10-CM | POA: Diagnosis not present

## 2022-09-12 DIAGNOSIS — R32 Unspecified urinary incontinence: Secondary | ICD-10-CM | POA: Diagnosis not present

## 2022-09-19 DIAGNOSIS — F0280 Dementia in other diseases classified elsewhere without behavioral disturbance: Secondary | ICD-10-CM | POA: Diagnosis not present

## 2022-09-19 DIAGNOSIS — G3101 Pick's disease: Secondary | ICD-10-CM | POA: Diagnosis not present

## 2022-09-19 DIAGNOSIS — Z87440 Personal history of urinary (tract) infections: Secondary | ICD-10-CM | POA: Diagnosis not present

## 2022-09-19 DIAGNOSIS — R32 Unspecified urinary incontinence: Secondary | ICD-10-CM | POA: Diagnosis not present

## 2022-09-19 DIAGNOSIS — G40909 Epilepsy, unspecified, not intractable, without status epilepticus: Secondary | ICD-10-CM | POA: Diagnosis not present

## 2022-09-19 DIAGNOSIS — R1310 Dysphagia, unspecified: Secondary | ICD-10-CM | POA: Diagnosis not present

## 2022-09-26 DIAGNOSIS — G40909 Epilepsy, unspecified, not intractable, without status epilepticus: Secondary | ICD-10-CM | POA: Diagnosis not present

## 2022-09-26 DIAGNOSIS — G3101 Pick's disease: Secondary | ICD-10-CM | POA: Diagnosis not present

## 2022-09-26 DIAGNOSIS — F0280 Dementia in other diseases classified elsewhere without behavioral disturbance: Secondary | ICD-10-CM | POA: Diagnosis not present

## 2022-09-26 DIAGNOSIS — R32 Unspecified urinary incontinence: Secondary | ICD-10-CM | POA: Diagnosis not present

## 2022-09-26 DIAGNOSIS — R1310 Dysphagia, unspecified: Secondary | ICD-10-CM | POA: Diagnosis not present

## 2022-09-26 DIAGNOSIS — Z87440 Personal history of urinary (tract) infections: Secondary | ICD-10-CM | POA: Diagnosis not present

## 2022-10-03 DIAGNOSIS — R1310 Dysphagia, unspecified: Secondary | ICD-10-CM | POA: Diagnosis not present

## 2022-10-03 DIAGNOSIS — R32 Unspecified urinary incontinence: Secondary | ICD-10-CM | POA: Diagnosis not present

## 2022-10-03 DIAGNOSIS — G3101 Pick's disease: Secondary | ICD-10-CM | POA: Diagnosis not present

## 2022-10-03 DIAGNOSIS — G40909 Epilepsy, unspecified, not intractable, without status epilepticus: Secondary | ICD-10-CM | POA: Diagnosis not present

## 2022-10-03 DIAGNOSIS — F0280 Dementia in other diseases classified elsewhere without behavioral disturbance: Secondary | ICD-10-CM | POA: Diagnosis not present

## 2022-10-03 DIAGNOSIS — Z87440 Personal history of urinary (tract) infections: Secondary | ICD-10-CM | POA: Diagnosis not present

## 2022-10-04 DIAGNOSIS — G40909 Epilepsy, unspecified, not intractable, without status epilepticus: Secondary | ICD-10-CM | POA: Diagnosis not present

## 2022-10-04 DIAGNOSIS — R1310 Dysphagia, unspecified: Secondary | ICD-10-CM | POA: Diagnosis not present

## 2022-10-04 DIAGNOSIS — Z87440 Personal history of urinary (tract) infections: Secondary | ICD-10-CM | POA: Diagnosis not present

## 2022-10-04 DIAGNOSIS — F0280 Dementia in other diseases classified elsewhere without behavioral disturbance: Secondary | ICD-10-CM | POA: Diagnosis not present

## 2022-10-04 DIAGNOSIS — R32 Unspecified urinary incontinence: Secondary | ICD-10-CM | POA: Diagnosis not present

## 2022-10-04 DIAGNOSIS — G3101 Pick's disease: Secondary | ICD-10-CM | POA: Diagnosis not present

## 2022-10-05 DIAGNOSIS — R1310 Dysphagia, unspecified: Secondary | ICD-10-CM | POA: Diagnosis not present

## 2022-10-05 DIAGNOSIS — G40909 Epilepsy, unspecified, not intractable, without status epilepticus: Secondary | ICD-10-CM | POA: Diagnosis not present

## 2022-10-05 DIAGNOSIS — G3101 Pick's disease: Secondary | ICD-10-CM | POA: Diagnosis not present

## 2022-10-05 DIAGNOSIS — F0280 Dementia in other diseases classified elsewhere without behavioral disturbance: Secondary | ICD-10-CM | POA: Diagnosis not present

## 2022-10-05 DIAGNOSIS — R32 Unspecified urinary incontinence: Secondary | ICD-10-CM | POA: Diagnosis not present

## 2022-10-05 DIAGNOSIS — Z87440 Personal history of urinary (tract) infections: Secondary | ICD-10-CM | POA: Diagnosis not present

## 2022-10-07 DIAGNOSIS — R1310 Dysphagia, unspecified: Secondary | ICD-10-CM | POA: Diagnosis not present

## 2022-10-07 DIAGNOSIS — G3101 Pick's disease: Secondary | ICD-10-CM | POA: Diagnosis not present

## 2022-10-07 DIAGNOSIS — Z741 Need for assistance with personal care: Secondary | ICD-10-CM | POA: Diagnosis not present

## 2022-10-07 DIAGNOSIS — R159 Full incontinence of feces: Secondary | ICD-10-CM | POA: Diagnosis not present

## 2022-10-07 DIAGNOSIS — F0280 Dementia in other diseases classified elsewhere without behavioral disturbance: Secondary | ICD-10-CM | POA: Diagnosis not present

## 2022-10-07 DIAGNOSIS — G40909 Epilepsy, unspecified, not intractable, without status epilepticus: Secondary | ICD-10-CM | POA: Diagnosis not present

## 2022-10-07 DIAGNOSIS — Z87440 Personal history of urinary (tract) infections: Secondary | ICD-10-CM | POA: Diagnosis not present

## 2022-10-07 DIAGNOSIS — R32 Unspecified urinary incontinence: Secondary | ICD-10-CM | POA: Diagnosis not present

## 2022-10-10 DIAGNOSIS — F0280 Dementia in other diseases classified elsewhere without behavioral disturbance: Secondary | ICD-10-CM | POA: Diagnosis not present

## 2022-10-10 DIAGNOSIS — R1310 Dysphagia, unspecified: Secondary | ICD-10-CM | POA: Diagnosis not present

## 2022-10-10 DIAGNOSIS — G3101 Pick's disease: Secondary | ICD-10-CM | POA: Diagnosis not present

## 2022-10-10 DIAGNOSIS — R32 Unspecified urinary incontinence: Secondary | ICD-10-CM | POA: Diagnosis not present

## 2022-10-10 DIAGNOSIS — G40909 Epilepsy, unspecified, not intractable, without status epilepticus: Secondary | ICD-10-CM | POA: Diagnosis not present

## 2022-10-10 DIAGNOSIS — Z87440 Personal history of urinary (tract) infections: Secondary | ICD-10-CM | POA: Diagnosis not present

## 2022-10-17 DIAGNOSIS — R1310 Dysphagia, unspecified: Secondary | ICD-10-CM | POA: Diagnosis not present

## 2022-10-17 DIAGNOSIS — F0280 Dementia in other diseases classified elsewhere without behavioral disturbance: Secondary | ICD-10-CM | POA: Diagnosis not present

## 2022-10-17 DIAGNOSIS — G40909 Epilepsy, unspecified, not intractable, without status epilepticus: Secondary | ICD-10-CM | POA: Diagnosis not present

## 2022-10-17 DIAGNOSIS — R32 Unspecified urinary incontinence: Secondary | ICD-10-CM | POA: Diagnosis not present

## 2022-10-17 DIAGNOSIS — Z87440 Personal history of urinary (tract) infections: Secondary | ICD-10-CM | POA: Diagnosis not present

## 2022-10-17 DIAGNOSIS — G3101 Pick's disease: Secondary | ICD-10-CM | POA: Diagnosis not present

## 2022-10-24 DIAGNOSIS — R32 Unspecified urinary incontinence: Secondary | ICD-10-CM | POA: Diagnosis not present

## 2022-10-24 DIAGNOSIS — Z87440 Personal history of urinary (tract) infections: Secondary | ICD-10-CM | POA: Diagnosis not present

## 2022-10-24 DIAGNOSIS — F0280 Dementia in other diseases classified elsewhere without behavioral disturbance: Secondary | ICD-10-CM | POA: Diagnosis not present

## 2022-10-24 DIAGNOSIS — G3101 Pick's disease: Secondary | ICD-10-CM | POA: Diagnosis not present

## 2022-10-24 DIAGNOSIS — R1310 Dysphagia, unspecified: Secondary | ICD-10-CM | POA: Diagnosis not present

## 2022-10-24 DIAGNOSIS — G40909 Epilepsy, unspecified, not intractable, without status epilepticus: Secondary | ICD-10-CM | POA: Diagnosis not present

## 2022-10-31 DIAGNOSIS — G3101 Pick's disease: Secondary | ICD-10-CM | POA: Diagnosis not present

## 2022-10-31 DIAGNOSIS — Z87440 Personal history of urinary (tract) infections: Secondary | ICD-10-CM | POA: Diagnosis not present

## 2022-10-31 DIAGNOSIS — G40909 Epilepsy, unspecified, not intractable, without status epilepticus: Secondary | ICD-10-CM | POA: Diagnosis not present

## 2022-10-31 DIAGNOSIS — R32 Unspecified urinary incontinence: Secondary | ICD-10-CM | POA: Diagnosis not present

## 2022-10-31 DIAGNOSIS — R1310 Dysphagia, unspecified: Secondary | ICD-10-CM | POA: Diagnosis not present

## 2022-10-31 DIAGNOSIS — F0280 Dementia in other diseases classified elsewhere without behavioral disturbance: Secondary | ICD-10-CM | POA: Diagnosis not present

## 2022-11-02 DIAGNOSIS — R32 Unspecified urinary incontinence: Secondary | ICD-10-CM | POA: Diagnosis not present

## 2022-11-02 DIAGNOSIS — G3101 Pick's disease: Secondary | ICD-10-CM | POA: Diagnosis not present

## 2022-11-02 DIAGNOSIS — F0280 Dementia in other diseases classified elsewhere without behavioral disturbance: Secondary | ICD-10-CM | POA: Diagnosis not present

## 2022-11-02 DIAGNOSIS — G40909 Epilepsy, unspecified, not intractable, without status epilepticus: Secondary | ICD-10-CM | POA: Diagnosis not present

## 2022-11-02 DIAGNOSIS — Z87440 Personal history of urinary (tract) infections: Secondary | ICD-10-CM | POA: Diagnosis not present

## 2022-11-02 DIAGNOSIS — R1310 Dysphagia, unspecified: Secondary | ICD-10-CM | POA: Diagnosis not present

## 2022-11-06 DIAGNOSIS — G40909 Epilepsy, unspecified, not intractable, without status epilepticus: Secondary | ICD-10-CM | POA: Diagnosis not present

## 2022-11-06 DIAGNOSIS — G3101 Pick's disease: Secondary | ICD-10-CM | POA: Diagnosis not present

## 2022-11-06 DIAGNOSIS — F0280 Dementia in other diseases classified elsewhere without behavioral disturbance: Secondary | ICD-10-CM | POA: Diagnosis not present

## 2022-11-06 DIAGNOSIS — R159 Full incontinence of feces: Secondary | ICD-10-CM | POA: Diagnosis not present

## 2022-11-06 DIAGNOSIS — Z741 Need for assistance with personal care: Secondary | ICD-10-CM | POA: Diagnosis not present

## 2022-11-06 DIAGNOSIS — R1310 Dysphagia, unspecified: Secondary | ICD-10-CM | POA: Diagnosis not present

## 2022-11-06 DIAGNOSIS — R32 Unspecified urinary incontinence: Secondary | ICD-10-CM | POA: Diagnosis not present

## 2022-11-06 DIAGNOSIS — Z87440 Personal history of urinary (tract) infections: Secondary | ICD-10-CM | POA: Diagnosis not present

## 2022-11-07 DIAGNOSIS — G3101 Pick's disease: Secondary | ICD-10-CM | POA: Diagnosis not present

## 2022-11-07 DIAGNOSIS — Z87440 Personal history of urinary (tract) infections: Secondary | ICD-10-CM | POA: Diagnosis not present

## 2022-11-07 DIAGNOSIS — G40909 Epilepsy, unspecified, not intractable, without status epilepticus: Secondary | ICD-10-CM | POA: Diagnosis not present

## 2022-11-07 DIAGNOSIS — R1310 Dysphagia, unspecified: Secondary | ICD-10-CM | POA: Diagnosis not present

## 2022-11-07 DIAGNOSIS — F0280 Dementia in other diseases classified elsewhere without behavioral disturbance: Secondary | ICD-10-CM | POA: Diagnosis not present

## 2022-11-07 DIAGNOSIS — R32 Unspecified urinary incontinence: Secondary | ICD-10-CM | POA: Diagnosis not present

## 2022-11-14 DIAGNOSIS — Z87440 Personal history of urinary (tract) infections: Secondary | ICD-10-CM | POA: Diagnosis not present

## 2022-11-14 DIAGNOSIS — R1310 Dysphagia, unspecified: Secondary | ICD-10-CM | POA: Diagnosis not present

## 2022-11-14 DIAGNOSIS — F0280 Dementia in other diseases classified elsewhere without behavioral disturbance: Secondary | ICD-10-CM | POA: Diagnosis not present

## 2022-11-14 DIAGNOSIS — R32 Unspecified urinary incontinence: Secondary | ICD-10-CM | POA: Diagnosis not present

## 2022-11-14 DIAGNOSIS — G3101 Pick's disease: Secondary | ICD-10-CM | POA: Diagnosis not present

## 2022-11-14 DIAGNOSIS — G40909 Epilepsy, unspecified, not intractable, without status epilepticus: Secondary | ICD-10-CM | POA: Diagnosis not present

## 2022-11-20 DIAGNOSIS — Z87440 Personal history of urinary (tract) infections: Secondary | ICD-10-CM | POA: Diagnosis not present

## 2022-11-20 DIAGNOSIS — F0280 Dementia in other diseases classified elsewhere without behavioral disturbance: Secondary | ICD-10-CM | POA: Diagnosis not present

## 2022-11-20 DIAGNOSIS — G40909 Epilepsy, unspecified, not intractable, without status epilepticus: Secondary | ICD-10-CM | POA: Diagnosis not present

## 2022-11-20 DIAGNOSIS — R32 Unspecified urinary incontinence: Secondary | ICD-10-CM | POA: Diagnosis not present

## 2022-11-20 DIAGNOSIS — G3101 Pick's disease: Secondary | ICD-10-CM | POA: Diagnosis not present

## 2022-11-20 DIAGNOSIS — R1310 Dysphagia, unspecified: Secondary | ICD-10-CM | POA: Diagnosis not present

## 2022-11-21 DIAGNOSIS — G3101 Pick's disease: Secondary | ICD-10-CM | POA: Diagnosis not present

## 2022-11-21 DIAGNOSIS — Z87440 Personal history of urinary (tract) infections: Secondary | ICD-10-CM | POA: Diagnosis not present

## 2022-11-21 DIAGNOSIS — F0280 Dementia in other diseases classified elsewhere without behavioral disturbance: Secondary | ICD-10-CM | POA: Diagnosis not present

## 2022-11-21 DIAGNOSIS — R1310 Dysphagia, unspecified: Secondary | ICD-10-CM | POA: Diagnosis not present

## 2022-11-21 DIAGNOSIS — G40909 Epilepsy, unspecified, not intractable, without status epilepticus: Secondary | ICD-10-CM | POA: Diagnosis not present

## 2022-11-21 DIAGNOSIS — R32 Unspecified urinary incontinence: Secondary | ICD-10-CM | POA: Diagnosis not present

## 2022-11-28 DIAGNOSIS — G40909 Epilepsy, unspecified, not intractable, without status epilepticus: Secondary | ICD-10-CM | POA: Diagnosis not present

## 2022-11-28 DIAGNOSIS — F0280 Dementia in other diseases classified elsewhere without behavioral disturbance: Secondary | ICD-10-CM | POA: Diagnosis not present

## 2022-11-28 DIAGNOSIS — G3101 Pick's disease: Secondary | ICD-10-CM | POA: Diagnosis not present

## 2022-11-28 DIAGNOSIS — R32 Unspecified urinary incontinence: Secondary | ICD-10-CM | POA: Diagnosis not present

## 2022-11-28 DIAGNOSIS — Z87440 Personal history of urinary (tract) infections: Secondary | ICD-10-CM | POA: Diagnosis not present

## 2022-11-28 DIAGNOSIS — R1310 Dysphagia, unspecified: Secondary | ICD-10-CM | POA: Diagnosis not present

## 2022-12-05 DIAGNOSIS — G40909 Epilepsy, unspecified, not intractable, without status epilepticus: Secondary | ICD-10-CM | POA: Diagnosis not present

## 2022-12-05 DIAGNOSIS — R32 Unspecified urinary incontinence: Secondary | ICD-10-CM | POA: Diagnosis not present

## 2022-12-05 DIAGNOSIS — R1310 Dysphagia, unspecified: Secondary | ICD-10-CM | POA: Diagnosis not present

## 2022-12-05 DIAGNOSIS — F0280 Dementia in other diseases classified elsewhere without behavioral disturbance: Secondary | ICD-10-CM | POA: Diagnosis not present

## 2022-12-05 DIAGNOSIS — G3101 Pick's disease: Secondary | ICD-10-CM | POA: Diagnosis not present

## 2022-12-05 DIAGNOSIS — Z87440 Personal history of urinary (tract) infections: Secondary | ICD-10-CM | POA: Diagnosis not present

## 2022-12-07 DIAGNOSIS — R1310 Dysphagia, unspecified: Secondary | ICD-10-CM | POA: Diagnosis not present

## 2022-12-07 DIAGNOSIS — Z87440 Personal history of urinary (tract) infections: Secondary | ICD-10-CM | POA: Diagnosis not present

## 2022-12-07 DIAGNOSIS — R159 Full incontinence of feces: Secondary | ICD-10-CM | POA: Diagnosis not present

## 2022-12-07 DIAGNOSIS — F0280 Dementia in other diseases classified elsewhere without behavioral disturbance: Secondary | ICD-10-CM | POA: Diagnosis not present

## 2022-12-07 DIAGNOSIS — R32 Unspecified urinary incontinence: Secondary | ICD-10-CM | POA: Diagnosis not present

## 2022-12-07 DIAGNOSIS — G40909 Epilepsy, unspecified, not intractable, without status epilepticus: Secondary | ICD-10-CM | POA: Diagnosis not present

## 2022-12-07 DIAGNOSIS — Z741 Need for assistance with personal care: Secondary | ICD-10-CM | POA: Diagnosis not present

## 2022-12-07 DIAGNOSIS — G3101 Pick's disease: Secondary | ICD-10-CM | POA: Diagnosis not present

## 2022-12-12 DIAGNOSIS — R32 Unspecified urinary incontinence: Secondary | ICD-10-CM | POA: Diagnosis not present

## 2022-12-12 DIAGNOSIS — F0280 Dementia in other diseases classified elsewhere without behavioral disturbance: Secondary | ICD-10-CM | POA: Diagnosis not present

## 2022-12-12 DIAGNOSIS — G40909 Epilepsy, unspecified, not intractable, without status epilepticus: Secondary | ICD-10-CM | POA: Diagnosis not present

## 2022-12-12 DIAGNOSIS — G3101 Pick's disease: Secondary | ICD-10-CM | POA: Diagnosis not present

## 2022-12-12 DIAGNOSIS — Z87440 Personal history of urinary (tract) infections: Secondary | ICD-10-CM | POA: Diagnosis not present

## 2022-12-12 DIAGNOSIS — R1310 Dysphagia, unspecified: Secondary | ICD-10-CM | POA: Diagnosis not present

## 2022-12-14 DIAGNOSIS — R1310 Dysphagia, unspecified: Secondary | ICD-10-CM | POA: Diagnosis not present

## 2022-12-14 DIAGNOSIS — F0280 Dementia in other diseases classified elsewhere without behavioral disturbance: Secondary | ICD-10-CM | POA: Diagnosis not present

## 2022-12-14 DIAGNOSIS — G3101 Pick's disease: Secondary | ICD-10-CM | POA: Diagnosis not present

## 2022-12-14 DIAGNOSIS — G40909 Epilepsy, unspecified, not intractable, without status epilepticus: Secondary | ICD-10-CM | POA: Diagnosis not present

## 2022-12-14 DIAGNOSIS — Z87440 Personal history of urinary (tract) infections: Secondary | ICD-10-CM | POA: Diagnosis not present

## 2022-12-14 DIAGNOSIS — R32 Unspecified urinary incontinence: Secondary | ICD-10-CM | POA: Diagnosis not present

## 2022-12-19 DIAGNOSIS — R1310 Dysphagia, unspecified: Secondary | ICD-10-CM | POA: Diagnosis not present

## 2022-12-19 DIAGNOSIS — Z87440 Personal history of urinary (tract) infections: Secondary | ICD-10-CM | POA: Diagnosis not present

## 2022-12-19 DIAGNOSIS — G3101 Pick's disease: Secondary | ICD-10-CM | POA: Diagnosis not present

## 2022-12-19 DIAGNOSIS — G40909 Epilepsy, unspecified, not intractable, without status epilepticus: Secondary | ICD-10-CM | POA: Diagnosis not present

## 2022-12-19 DIAGNOSIS — R32 Unspecified urinary incontinence: Secondary | ICD-10-CM | POA: Diagnosis not present

## 2022-12-19 DIAGNOSIS — F0280 Dementia in other diseases classified elsewhere without behavioral disturbance: Secondary | ICD-10-CM | POA: Diagnosis not present

## 2022-12-26 DIAGNOSIS — R32 Unspecified urinary incontinence: Secondary | ICD-10-CM | POA: Diagnosis not present

## 2022-12-26 DIAGNOSIS — G3101 Pick's disease: Secondary | ICD-10-CM | POA: Diagnosis not present

## 2022-12-26 DIAGNOSIS — G40909 Epilepsy, unspecified, not intractable, without status epilepticus: Secondary | ICD-10-CM | POA: Diagnosis not present

## 2022-12-26 DIAGNOSIS — R1310 Dysphagia, unspecified: Secondary | ICD-10-CM | POA: Diagnosis not present

## 2022-12-26 DIAGNOSIS — Z87440 Personal history of urinary (tract) infections: Secondary | ICD-10-CM | POA: Diagnosis not present

## 2022-12-26 DIAGNOSIS — F0280 Dementia in other diseases classified elsewhere without behavioral disturbance: Secondary | ICD-10-CM | POA: Diagnosis not present

## 2023-01-07 DIAGNOSIS — R1310 Dysphagia, unspecified: Secondary | ICD-10-CM | POA: Diagnosis not present

## 2023-01-07 DIAGNOSIS — F0280 Dementia in other diseases classified elsewhere without behavioral disturbance: Secondary | ICD-10-CM | POA: Diagnosis not present

## 2023-01-07 DIAGNOSIS — Z87440 Personal history of urinary (tract) infections: Secondary | ICD-10-CM | POA: Diagnosis not present

## 2023-01-07 DIAGNOSIS — R159 Full incontinence of feces: Secondary | ICD-10-CM | POA: Diagnosis not present

## 2023-01-07 DIAGNOSIS — G3101 Pick's disease: Secondary | ICD-10-CM | POA: Diagnosis not present

## 2023-01-07 DIAGNOSIS — R32 Unspecified urinary incontinence: Secondary | ICD-10-CM | POA: Diagnosis not present

## 2023-01-07 DIAGNOSIS — G40909 Epilepsy, unspecified, not intractable, without status epilepticus: Secondary | ICD-10-CM | POA: Diagnosis not present

## 2023-01-07 DIAGNOSIS — Z741 Need for assistance with personal care: Secondary | ICD-10-CM | POA: Diagnosis not present

## 2023-01-08 DIAGNOSIS — R1310 Dysphagia, unspecified: Secondary | ICD-10-CM | POA: Diagnosis not present

## 2023-01-08 DIAGNOSIS — R32 Unspecified urinary incontinence: Secondary | ICD-10-CM | POA: Diagnosis not present

## 2023-01-08 DIAGNOSIS — F0280 Dementia in other diseases classified elsewhere without behavioral disturbance: Secondary | ICD-10-CM | POA: Diagnosis not present

## 2023-01-08 DIAGNOSIS — G3101 Pick's disease: Secondary | ICD-10-CM | POA: Diagnosis not present

## 2023-01-08 DIAGNOSIS — Z87440 Personal history of urinary (tract) infections: Secondary | ICD-10-CM | POA: Diagnosis not present

## 2023-01-08 DIAGNOSIS — G40909 Epilepsy, unspecified, not intractable, without status epilepticus: Secondary | ICD-10-CM | POA: Diagnosis not present

## 2023-01-09 DIAGNOSIS — F0280 Dementia in other diseases classified elsewhere without behavioral disturbance: Secondary | ICD-10-CM | POA: Diagnosis not present

## 2023-01-09 DIAGNOSIS — G40909 Epilepsy, unspecified, not intractable, without status epilepticus: Secondary | ICD-10-CM | POA: Diagnosis not present

## 2023-01-09 DIAGNOSIS — Z87440 Personal history of urinary (tract) infections: Secondary | ICD-10-CM | POA: Diagnosis not present

## 2023-01-09 DIAGNOSIS — R32 Unspecified urinary incontinence: Secondary | ICD-10-CM | POA: Diagnosis not present

## 2023-01-09 DIAGNOSIS — G3101 Pick's disease: Secondary | ICD-10-CM | POA: Diagnosis not present

## 2023-01-09 DIAGNOSIS — R1310 Dysphagia, unspecified: Secondary | ICD-10-CM | POA: Diagnosis not present

## 2023-01-10 DIAGNOSIS — G40909 Epilepsy, unspecified, not intractable, without status epilepticus: Secondary | ICD-10-CM | POA: Diagnosis not present

## 2023-01-10 DIAGNOSIS — R32 Unspecified urinary incontinence: Secondary | ICD-10-CM | POA: Diagnosis not present

## 2023-01-10 DIAGNOSIS — R1310 Dysphagia, unspecified: Secondary | ICD-10-CM | POA: Diagnosis not present

## 2023-01-10 DIAGNOSIS — G3101 Pick's disease: Secondary | ICD-10-CM | POA: Diagnosis not present

## 2023-01-10 DIAGNOSIS — Z87440 Personal history of urinary (tract) infections: Secondary | ICD-10-CM | POA: Diagnosis not present

## 2023-01-10 DIAGNOSIS — F0280 Dementia in other diseases classified elsewhere without behavioral disturbance: Secondary | ICD-10-CM | POA: Diagnosis not present

## 2023-01-11 DIAGNOSIS — R32 Unspecified urinary incontinence: Secondary | ICD-10-CM | POA: Diagnosis not present

## 2023-01-11 DIAGNOSIS — Z87440 Personal history of urinary (tract) infections: Secondary | ICD-10-CM | POA: Diagnosis not present

## 2023-01-11 DIAGNOSIS — F0280 Dementia in other diseases classified elsewhere without behavioral disturbance: Secondary | ICD-10-CM | POA: Diagnosis not present

## 2023-01-11 DIAGNOSIS — R1310 Dysphagia, unspecified: Secondary | ICD-10-CM | POA: Diagnosis not present

## 2023-01-11 DIAGNOSIS — G40909 Epilepsy, unspecified, not intractable, without status epilepticus: Secondary | ICD-10-CM | POA: Diagnosis not present

## 2023-01-11 DIAGNOSIS — G3101 Pick's disease: Secondary | ICD-10-CM | POA: Diagnosis not present

## 2023-01-12 DIAGNOSIS — G3101 Pick's disease: Secondary | ICD-10-CM | POA: Diagnosis not present

## 2023-01-12 DIAGNOSIS — F0280 Dementia in other diseases classified elsewhere without behavioral disturbance: Secondary | ICD-10-CM | POA: Diagnosis not present

## 2023-01-12 DIAGNOSIS — R32 Unspecified urinary incontinence: Secondary | ICD-10-CM | POA: Diagnosis not present

## 2023-01-12 DIAGNOSIS — G40909 Epilepsy, unspecified, not intractable, without status epilepticus: Secondary | ICD-10-CM | POA: Diagnosis not present

## 2023-01-12 DIAGNOSIS — Z87440 Personal history of urinary (tract) infections: Secondary | ICD-10-CM | POA: Diagnosis not present

## 2023-01-12 DIAGNOSIS — R1310 Dysphagia, unspecified: Secondary | ICD-10-CM | POA: Diagnosis not present

## 2023-01-13 DIAGNOSIS — Z87440 Personal history of urinary (tract) infections: Secondary | ICD-10-CM | POA: Diagnosis not present

## 2023-01-13 DIAGNOSIS — G40909 Epilepsy, unspecified, not intractable, without status epilepticus: Secondary | ICD-10-CM | POA: Diagnosis not present

## 2023-01-13 DIAGNOSIS — G3101 Pick's disease: Secondary | ICD-10-CM | POA: Diagnosis not present

## 2023-01-13 DIAGNOSIS — R1310 Dysphagia, unspecified: Secondary | ICD-10-CM | POA: Diagnosis not present

## 2023-01-13 DIAGNOSIS — R32 Unspecified urinary incontinence: Secondary | ICD-10-CM | POA: Diagnosis not present

## 2023-01-13 DIAGNOSIS — F0280 Dementia in other diseases classified elsewhere without behavioral disturbance: Secondary | ICD-10-CM | POA: Diagnosis not present

## 2023-01-14 DIAGNOSIS — Z87440 Personal history of urinary (tract) infections: Secondary | ICD-10-CM | POA: Diagnosis not present

## 2023-01-14 DIAGNOSIS — R1310 Dysphagia, unspecified: Secondary | ICD-10-CM | POA: Diagnosis not present

## 2023-01-14 DIAGNOSIS — G3101 Pick's disease: Secondary | ICD-10-CM | POA: Diagnosis not present

## 2023-01-14 DIAGNOSIS — F0280 Dementia in other diseases classified elsewhere without behavioral disturbance: Secondary | ICD-10-CM | POA: Diagnosis not present

## 2023-01-14 DIAGNOSIS — R32 Unspecified urinary incontinence: Secondary | ICD-10-CM | POA: Diagnosis not present

## 2023-01-14 DIAGNOSIS — G40909 Epilepsy, unspecified, not intractable, without status epilepticus: Secondary | ICD-10-CM | POA: Diagnosis not present

## 2023-01-15 DIAGNOSIS — R1310 Dysphagia, unspecified: Secondary | ICD-10-CM | POA: Diagnosis not present

## 2023-01-15 DIAGNOSIS — Z87440 Personal history of urinary (tract) infections: Secondary | ICD-10-CM | POA: Diagnosis not present

## 2023-01-15 DIAGNOSIS — F0280 Dementia in other diseases classified elsewhere without behavioral disturbance: Secondary | ICD-10-CM | POA: Diagnosis not present

## 2023-01-15 DIAGNOSIS — G40909 Epilepsy, unspecified, not intractable, without status epilepticus: Secondary | ICD-10-CM | POA: Diagnosis not present

## 2023-01-15 DIAGNOSIS — G3101 Pick's disease: Secondary | ICD-10-CM | POA: Diagnosis not present

## 2023-01-15 DIAGNOSIS — R32 Unspecified urinary incontinence: Secondary | ICD-10-CM | POA: Diagnosis not present

## 2023-01-16 DIAGNOSIS — R32 Unspecified urinary incontinence: Secondary | ICD-10-CM | POA: Diagnosis not present

## 2023-01-16 DIAGNOSIS — G40909 Epilepsy, unspecified, not intractable, without status epilepticus: Secondary | ICD-10-CM | POA: Diagnosis not present

## 2023-01-16 DIAGNOSIS — F0280 Dementia in other diseases classified elsewhere without behavioral disturbance: Secondary | ICD-10-CM | POA: Diagnosis not present

## 2023-01-16 DIAGNOSIS — R1310 Dysphagia, unspecified: Secondary | ICD-10-CM | POA: Diagnosis not present

## 2023-01-16 DIAGNOSIS — G3101 Pick's disease: Secondary | ICD-10-CM | POA: Diagnosis not present

## 2023-01-16 DIAGNOSIS — Z87440 Personal history of urinary (tract) infections: Secondary | ICD-10-CM | POA: Diagnosis not present

## 2023-01-17 DIAGNOSIS — Z87440 Personal history of urinary (tract) infections: Secondary | ICD-10-CM | POA: Diagnosis not present

## 2023-01-17 DIAGNOSIS — G3101 Pick's disease: Secondary | ICD-10-CM | POA: Diagnosis not present

## 2023-01-17 DIAGNOSIS — R32 Unspecified urinary incontinence: Secondary | ICD-10-CM | POA: Diagnosis not present

## 2023-01-17 DIAGNOSIS — F0280 Dementia in other diseases classified elsewhere without behavioral disturbance: Secondary | ICD-10-CM | POA: Diagnosis not present

## 2023-01-17 DIAGNOSIS — R1310 Dysphagia, unspecified: Secondary | ICD-10-CM | POA: Diagnosis not present

## 2023-01-17 DIAGNOSIS — G40909 Epilepsy, unspecified, not intractable, without status epilepticus: Secondary | ICD-10-CM | POA: Diagnosis not present

## 2023-01-18 DIAGNOSIS — R32 Unspecified urinary incontinence: Secondary | ICD-10-CM | POA: Diagnosis not present

## 2023-01-18 DIAGNOSIS — G40909 Epilepsy, unspecified, not intractable, without status epilepticus: Secondary | ICD-10-CM | POA: Diagnosis not present

## 2023-01-18 DIAGNOSIS — G3101 Pick's disease: Secondary | ICD-10-CM | POA: Diagnosis not present

## 2023-01-18 DIAGNOSIS — F0280 Dementia in other diseases classified elsewhere without behavioral disturbance: Secondary | ICD-10-CM | POA: Diagnosis not present

## 2023-01-18 DIAGNOSIS — R1310 Dysphagia, unspecified: Secondary | ICD-10-CM | POA: Diagnosis not present

## 2023-01-18 DIAGNOSIS — Z87440 Personal history of urinary (tract) infections: Secondary | ICD-10-CM | POA: Diagnosis not present

## 2023-01-19 DIAGNOSIS — Z87440 Personal history of urinary (tract) infections: Secondary | ICD-10-CM | POA: Diagnosis not present

## 2023-01-19 DIAGNOSIS — F0280 Dementia in other diseases classified elsewhere without behavioral disturbance: Secondary | ICD-10-CM | POA: Diagnosis not present

## 2023-01-19 DIAGNOSIS — G40909 Epilepsy, unspecified, not intractable, without status epilepticus: Secondary | ICD-10-CM | POA: Diagnosis not present

## 2023-01-19 DIAGNOSIS — R1310 Dysphagia, unspecified: Secondary | ICD-10-CM | POA: Diagnosis not present

## 2023-01-19 DIAGNOSIS — R32 Unspecified urinary incontinence: Secondary | ICD-10-CM | POA: Diagnosis not present

## 2023-01-19 DIAGNOSIS — G3101 Pick's disease: Secondary | ICD-10-CM | POA: Diagnosis not present

## 2023-01-20 DIAGNOSIS — R32 Unspecified urinary incontinence: Secondary | ICD-10-CM | POA: Diagnosis not present

## 2023-01-20 DIAGNOSIS — G40909 Epilepsy, unspecified, not intractable, without status epilepticus: Secondary | ICD-10-CM | POA: Diagnosis not present

## 2023-01-20 DIAGNOSIS — F0280 Dementia in other diseases classified elsewhere without behavioral disturbance: Secondary | ICD-10-CM | POA: Diagnosis not present

## 2023-01-20 DIAGNOSIS — R1310 Dysphagia, unspecified: Secondary | ICD-10-CM | POA: Diagnosis not present

## 2023-01-20 DIAGNOSIS — G3101 Pick's disease: Secondary | ICD-10-CM | POA: Diagnosis not present

## 2023-01-20 DIAGNOSIS — Z87440 Personal history of urinary (tract) infections: Secondary | ICD-10-CM | POA: Diagnosis not present

## 2023-01-21 DIAGNOSIS — Z87440 Personal history of urinary (tract) infections: Secondary | ICD-10-CM | POA: Diagnosis not present

## 2023-01-21 DIAGNOSIS — G40909 Epilepsy, unspecified, not intractable, without status epilepticus: Secondary | ICD-10-CM | POA: Diagnosis not present

## 2023-01-21 DIAGNOSIS — F0280 Dementia in other diseases classified elsewhere without behavioral disturbance: Secondary | ICD-10-CM | POA: Diagnosis not present

## 2023-01-21 DIAGNOSIS — G3101 Pick's disease: Secondary | ICD-10-CM | POA: Diagnosis not present

## 2023-01-21 DIAGNOSIS — R32 Unspecified urinary incontinence: Secondary | ICD-10-CM | POA: Diagnosis not present

## 2023-01-21 DIAGNOSIS — R1310 Dysphagia, unspecified: Secondary | ICD-10-CM | POA: Diagnosis not present

## 2023-01-22 DIAGNOSIS — G40909 Epilepsy, unspecified, not intractable, without status epilepticus: Secondary | ICD-10-CM | POA: Diagnosis not present

## 2023-01-22 DIAGNOSIS — R32 Unspecified urinary incontinence: Secondary | ICD-10-CM | POA: Diagnosis not present

## 2023-01-22 DIAGNOSIS — Z87440 Personal history of urinary (tract) infections: Secondary | ICD-10-CM | POA: Diagnosis not present

## 2023-01-22 DIAGNOSIS — F0280 Dementia in other diseases classified elsewhere without behavioral disturbance: Secondary | ICD-10-CM | POA: Diagnosis not present

## 2023-01-22 DIAGNOSIS — R1310 Dysphagia, unspecified: Secondary | ICD-10-CM | POA: Diagnosis not present

## 2023-01-22 DIAGNOSIS — G3101 Pick's disease: Secondary | ICD-10-CM | POA: Diagnosis not present

## 2023-01-23 DIAGNOSIS — R32 Unspecified urinary incontinence: Secondary | ICD-10-CM | POA: Diagnosis not present

## 2023-01-23 DIAGNOSIS — R1310 Dysphagia, unspecified: Secondary | ICD-10-CM | POA: Diagnosis not present

## 2023-01-23 DIAGNOSIS — Z87440 Personal history of urinary (tract) infections: Secondary | ICD-10-CM | POA: Diagnosis not present

## 2023-01-23 DIAGNOSIS — G3101 Pick's disease: Secondary | ICD-10-CM | POA: Diagnosis not present

## 2023-01-23 DIAGNOSIS — G40909 Epilepsy, unspecified, not intractable, without status epilepticus: Secondary | ICD-10-CM | POA: Diagnosis not present

## 2023-01-23 DIAGNOSIS — F0280 Dementia in other diseases classified elsewhere without behavioral disturbance: Secondary | ICD-10-CM | POA: Diagnosis not present

## 2023-01-24 DIAGNOSIS — F0280 Dementia in other diseases classified elsewhere without behavioral disturbance: Secondary | ICD-10-CM | POA: Diagnosis not present

## 2023-01-24 DIAGNOSIS — G3101 Pick's disease: Secondary | ICD-10-CM | POA: Diagnosis not present

## 2023-01-24 DIAGNOSIS — Z87440 Personal history of urinary (tract) infections: Secondary | ICD-10-CM | POA: Diagnosis not present

## 2023-01-24 DIAGNOSIS — R1310 Dysphagia, unspecified: Secondary | ICD-10-CM | POA: Diagnosis not present

## 2023-01-24 DIAGNOSIS — R32 Unspecified urinary incontinence: Secondary | ICD-10-CM | POA: Diagnosis not present

## 2023-01-24 DIAGNOSIS — G40909 Epilepsy, unspecified, not intractable, without status epilepticus: Secondary | ICD-10-CM | POA: Diagnosis not present

## 2023-01-25 DIAGNOSIS — G3101 Pick's disease: Secondary | ICD-10-CM | POA: Diagnosis not present

## 2023-01-25 DIAGNOSIS — R32 Unspecified urinary incontinence: Secondary | ICD-10-CM | POA: Diagnosis not present

## 2023-01-25 DIAGNOSIS — R1310 Dysphagia, unspecified: Secondary | ICD-10-CM | POA: Diagnosis not present

## 2023-01-25 DIAGNOSIS — F0280 Dementia in other diseases classified elsewhere without behavioral disturbance: Secondary | ICD-10-CM | POA: Diagnosis not present

## 2023-01-25 DIAGNOSIS — Z87440 Personal history of urinary (tract) infections: Secondary | ICD-10-CM | POA: Diagnosis not present

## 2023-01-25 DIAGNOSIS — G40909 Epilepsy, unspecified, not intractable, without status epilepticus: Secondary | ICD-10-CM | POA: Diagnosis not present

## 2023-01-26 DIAGNOSIS — G40909 Epilepsy, unspecified, not intractable, without status epilepticus: Secondary | ICD-10-CM | POA: Diagnosis not present

## 2023-01-26 DIAGNOSIS — Z87440 Personal history of urinary (tract) infections: Secondary | ICD-10-CM | POA: Diagnosis not present

## 2023-01-26 DIAGNOSIS — R1310 Dysphagia, unspecified: Secondary | ICD-10-CM | POA: Diagnosis not present

## 2023-01-26 DIAGNOSIS — F0280 Dementia in other diseases classified elsewhere without behavioral disturbance: Secondary | ICD-10-CM | POA: Diagnosis not present

## 2023-01-26 DIAGNOSIS — R32 Unspecified urinary incontinence: Secondary | ICD-10-CM | POA: Diagnosis not present

## 2023-01-26 DIAGNOSIS — G3101 Pick's disease: Secondary | ICD-10-CM | POA: Diagnosis not present

## 2023-01-27 DIAGNOSIS — Z87440 Personal history of urinary (tract) infections: Secondary | ICD-10-CM | POA: Diagnosis not present

## 2023-01-27 DIAGNOSIS — G3101 Pick's disease: Secondary | ICD-10-CM | POA: Diagnosis not present

## 2023-01-27 DIAGNOSIS — R1310 Dysphagia, unspecified: Secondary | ICD-10-CM | POA: Diagnosis not present

## 2023-01-27 DIAGNOSIS — G40909 Epilepsy, unspecified, not intractable, without status epilepticus: Secondary | ICD-10-CM | POA: Diagnosis not present

## 2023-01-27 DIAGNOSIS — R32 Unspecified urinary incontinence: Secondary | ICD-10-CM | POA: Diagnosis not present

## 2023-01-27 DIAGNOSIS — F0280 Dementia in other diseases classified elsewhere without behavioral disturbance: Secondary | ICD-10-CM | POA: Diagnosis not present

## 2023-01-28 DIAGNOSIS — R1310 Dysphagia, unspecified: Secondary | ICD-10-CM | POA: Diagnosis not present

## 2023-01-28 DIAGNOSIS — F0280 Dementia in other diseases classified elsewhere without behavioral disturbance: Secondary | ICD-10-CM | POA: Diagnosis not present

## 2023-01-28 DIAGNOSIS — R32 Unspecified urinary incontinence: Secondary | ICD-10-CM | POA: Diagnosis not present

## 2023-01-28 DIAGNOSIS — G40909 Epilepsy, unspecified, not intractable, without status epilepticus: Secondary | ICD-10-CM | POA: Diagnosis not present

## 2023-01-28 DIAGNOSIS — Z87440 Personal history of urinary (tract) infections: Secondary | ICD-10-CM | POA: Diagnosis not present

## 2023-01-28 DIAGNOSIS — G3101 Pick's disease: Secondary | ICD-10-CM | POA: Diagnosis not present

## 2023-01-29 DIAGNOSIS — R32 Unspecified urinary incontinence: Secondary | ICD-10-CM | POA: Diagnosis not present

## 2023-01-29 DIAGNOSIS — F0280 Dementia in other diseases classified elsewhere without behavioral disturbance: Secondary | ICD-10-CM | POA: Diagnosis not present

## 2023-01-29 DIAGNOSIS — G40909 Epilepsy, unspecified, not intractable, without status epilepticus: Secondary | ICD-10-CM | POA: Diagnosis not present

## 2023-01-29 DIAGNOSIS — Z87440 Personal history of urinary (tract) infections: Secondary | ICD-10-CM | POA: Diagnosis not present

## 2023-01-29 DIAGNOSIS — R1310 Dysphagia, unspecified: Secondary | ICD-10-CM | POA: Diagnosis not present

## 2023-01-29 DIAGNOSIS — G3101 Pick's disease: Secondary | ICD-10-CM | POA: Diagnosis not present

## 2023-01-30 DIAGNOSIS — R1310 Dysphagia, unspecified: Secondary | ICD-10-CM | POA: Diagnosis not present

## 2023-01-30 DIAGNOSIS — R32 Unspecified urinary incontinence: Secondary | ICD-10-CM | POA: Diagnosis not present

## 2023-01-30 DIAGNOSIS — G3101 Pick's disease: Secondary | ICD-10-CM | POA: Diagnosis not present

## 2023-01-30 DIAGNOSIS — Z87440 Personal history of urinary (tract) infections: Secondary | ICD-10-CM | POA: Diagnosis not present

## 2023-01-30 DIAGNOSIS — F0280 Dementia in other diseases classified elsewhere without behavioral disturbance: Secondary | ICD-10-CM | POA: Diagnosis not present

## 2023-01-30 DIAGNOSIS — G40909 Epilepsy, unspecified, not intractable, without status epilepticus: Secondary | ICD-10-CM | POA: Diagnosis not present

## 2023-01-31 DIAGNOSIS — F0280 Dementia in other diseases classified elsewhere without behavioral disturbance: Secondary | ICD-10-CM | POA: Diagnosis not present

## 2023-01-31 DIAGNOSIS — G3101 Pick's disease: Secondary | ICD-10-CM | POA: Diagnosis not present

## 2023-01-31 DIAGNOSIS — R32 Unspecified urinary incontinence: Secondary | ICD-10-CM | POA: Diagnosis not present

## 2023-01-31 DIAGNOSIS — R1310 Dysphagia, unspecified: Secondary | ICD-10-CM | POA: Diagnosis not present

## 2023-01-31 DIAGNOSIS — G40909 Epilepsy, unspecified, not intractable, without status epilepticus: Secondary | ICD-10-CM | POA: Diagnosis not present

## 2023-01-31 DIAGNOSIS — Z87440 Personal history of urinary (tract) infections: Secondary | ICD-10-CM | POA: Diagnosis not present

## 2023-02-01 DIAGNOSIS — G40909 Epilepsy, unspecified, not intractable, without status epilepticus: Secondary | ICD-10-CM | POA: Diagnosis not present

## 2023-02-01 DIAGNOSIS — G3101 Pick's disease: Secondary | ICD-10-CM | POA: Diagnosis not present

## 2023-02-01 DIAGNOSIS — R1310 Dysphagia, unspecified: Secondary | ICD-10-CM | POA: Diagnosis not present

## 2023-02-01 DIAGNOSIS — R32 Unspecified urinary incontinence: Secondary | ICD-10-CM | POA: Diagnosis not present

## 2023-02-01 DIAGNOSIS — F0280 Dementia in other diseases classified elsewhere without behavioral disturbance: Secondary | ICD-10-CM | POA: Diagnosis not present

## 2023-02-01 DIAGNOSIS — Z87440 Personal history of urinary (tract) infections: Secondary | ICD-10-CM | POA: Diagnosis not present

## 2023-02-02 DIAGNOSIS — F0280 Dementia in other diseases classified elsewhere without behavioral disturbance: Secondary | ICD-10-CM | POA: Diagnosis not present

## 2023-02-02 DIAGNOSIS — Z87440 Personal history of urinary (tract) infections: Secondary | ICD-10-CM | POA: Diagnosis not present

## 2023-02-02 DIAGNOSIS — G3101 Pick's disease: Secondary | ICD-10-CM | POA: Diagnosis not present

## 2023-02-02 DIAGNOSIS — R32 Unspecified urinary incontinence: Secondary | ICD-10-CM | POA: Diagnosis not present

## 2023-02-02 DIAGNOSIS — R1310 Dysphagia, unspecified: Secondary | ICD-10-CM | POA: Diagnosis not present

## 2023-02-02 DIAGNOSIS — G40909 Epilepsy, unspecified, not intractable, without status epilepticus: Secondary | ICD-10-CM | POA: Diagnosis not present

## 2023-02-03 DIAGNOSIS — R1310 Dysphagia, unspecified: Secondary | ICD-10-CM | POA: Diagnosis not present

## 2023-02-03 DIAGNOSIS — Z87440 Personal history of urinary (tract) infections: Secondary | ICD-10-CM | POA: Diagnosis not present

## 2023-02-03 DIAGNOSIS — F0280 Dementia in other diseases classified elsewhere without behavioral disturbance: Secondary | ICD-10-CM | POA: Diagnosis not present

## 2023-02-03 DIAGNOSIS — G3101 Pick's disease: Secondary | ICD-10-CM | POA: Diagnosis not present

## 2023-02-03 DIAGNOSIS — G40909 Epilepsy, unspecified, not intractable, without status epilepticus: Secondary | ICD-10-CM | POA: Diagnosis not present

## 2023-02-03 DIAGNOSIS — R32 Unspecified urinary incontinence: Secondary | ICD-10-CM | POA: Diagnosis not present

## 2023-02-04 DIAGNOSIS — R32 Unspecified urinary incontinence: Secondary | ICD-10-CM | POA: Diagnosis not present

## 2023-02-04 DIAGNOSIS — G40909 Epilepsy, unspecified, not intractable, without status epilepticus: Secondary | ICD-10-CM | POA: Diagnosis not present

## 2023-02-04 DIAGNOSIS — G3101 Pick's disease: Secondary | ICD-10-CM | POA: Diagnosis not present

## 2023-02-04 DIAGNOSIS — R1310 Dysphagia, unspecified: Secondary | ICD-10-CM | POA: Diagnosis not present

## 2023-02-04 DIAGNOSIS — F0280 Dementia in other diseases classified elsewhere without behavioral disturbance: Secondary | ICD-10-CM | POA: Diagnosis not present

## 2023-02-04 DIAGNOSIS — Z87440 Personal history of urinary (tract) infections: Secondary | ICD-10-CM | POA: Diagnosis not present

## 2023-02-05 DIAGNOSIS — Z87440 Personal history of urinary (tract) infections: Secondary | ICD-10-CM | POA: Diagnosis not present

## 2023-02-05 DIAGNOSIS — R32 Unspecified urinary incontinence: Secondary | ICD-10-CM | POA: Diagnosis not present

## 2023-02-05 DIAGNOSIS — G40909 Epilepsy, unspecified, not intractable, without status epilepticus: Secondary | ICD-10-CM | POA: Diagnosis not present

## 2023-02-05 DIAGNOSIS — F0280 Dementia in other diseases classified elsewhere without behavioral disturbance: Secondary | ICD-10-CM | POA: Diagnosis not present

## 2023-02-05 DIAGNOSIS — R1310 Dysphagia, unspecified: Secondary | ICD-10-CM | POA: Diagnosis not present

## 2023-02-05 DIAGNOSIS — G3101 Pick's disease: Secondary | ICD-10-CM | POA: Diagnosis not present

## 2023-02-06 DIAGNOSIS — Z741 Need for assistance with personal care: Secondary | ICD-10-CM | POA: Diagnosis not present

## 2023-02-06 DIAGNOSIS — R1310 Dysphagia, unspecified: Secondary | ICD-10-CM | POA: Diagnosis not present

## 2023-02-06 DIAGNOSIS — Z87440 Personal history of urinary (tract) infections: Secondary | ICD-10-CM | POA: Diagnosis not present

## 2023-02-06 DIAGNOSIS — R159 Full incontinence of feces: Secondary | ICD-10-CM | POA: Diagnosis not present

## 2023-02-06 DIAGNOSIS — G40909 Epilepsy, unspecified, not intractable, without status epilepticus: Secondary | ICD-10-CM | POA: Diagnosis not present

## 2023-02-06 DIAGNOSIS — G3101 Pick's disease: Secondary | ICD-10-CM | POA: Diagnosis not present

## 2023-02-06 DIAGNOSIS — R32 Unspecified urinary incontinence: Secondary | ICD-10-CM | POA: Diagnosis not present

## 2023-02-06 DIAGNOSIS — F0280 Dementia in other diseases classified elsewhere without behavioral disturbance: Secondary | ICD-10-CM | POA: Diagnosis not present

## 2023-02-07 DIAGNOSIS — G3101 Pick's disease: Secondary | ICD-10-CM | POA: Diagnosis not present

## 2023-02-07 DIAGNOSIS — R32 Unspecified urinary incontinence: Secondary | ICD-10-CM | POA: Diagnosis not present

## 2023-02-07 DIAGNOSIS — Z87440 Personal history of urinary (tract) infections: Secondary | ICD-10-CM | POA: Diagnosis not present

## 2023-02-07 DIAGNOSIS — F0280 Dementia in other diseases classified elsewhere without behavioral disturbance: Secondary | ICD-10-CM | POA: Diagnosis not present

## 2023-02-07 DIAGNOSIS — G40909 Epilepsy, unspecified, not intractable, without status epilepticus: Secondary | ICD-10-CM | POA: Diagnosis not present

## 2023-02-07 DIAGNOSIS — R1310 Dysphagia, unspecified: Secondary | ICD-10-CM | POA: Diagnosis not present

## 2023-02-08 DIAGNOSIS — R1310 Dysphagia, unspecified: Secondary | ICD-10-CM | POA: Diagnosis not present

## 2023-02-08 DIAGNOSIS — G3101 Pick's disease: Secondary | ICD-10-CM | POA: Diagnosis not present

## 2023-02-08 DIAGNOSIS — G40909 Epilepsy, unspecified, not intractable, without status epilepticus: Secondary | ICD-10-CM | POA: Diagnosis not present

## 2023-02-08 DIAGNOSIS — F0280 Dementia in other diseases classified elsewhere without behavioral disturbance: Secondary | ICD-10-CM | POA: Diagnosis not present

## 2023-02-08 DIAGNOSIS — Z87440 Personal history of urinary (tract) infections: Secondary | ICD-10-CM | POA: Diagnosis not present

## 2023-02-08 DIAGNOSIS — R32 Unspecified urinary incontinence: Secondary | ICD-10-CM | POA: Diagnosis not present

## 2023-02-09 DIAGNOSIS — R32 Unspecified urinary incontinence: Secondary | ICD-10-CM | POA: Diagnosis not present

## 2023-02-09 DIAGNOSIS — F0280 Dementia in other diseases classified elsewhere without behavioral disturbance: Secondary | ICD-10-CM | POA: Diagnosis not present

## 2023-02-09 DIAGNOSIS — R1310 Dysphagia, unspecified: Secondary | ICD-10-CM | POA: Diagnosis not present

## 2023-02-09 DIAGNOSIS — G3101 Pick's disease: Secondary | ICD-10-CM | POA: Diagnosis not present

## 2023-02-09 DIAGNOSIS — Z87440 Personal history of urinary (tract) infections: Secondary | ICD-10-CM | POA: Diagnosis not present

## 2023-02-09 DIAGNOSIS — G40909 Epilepsy, unspecified, not intractable, without status epilepticus: Secondary | ICD-10-CM | POA: Diagnosis not present

## 2023-02-10 DIAGNOSIS — R1310 Dysphagia, unspecified: Secondary | ICD-10-CM | POA: Diagnosis not present

## 2023-02-10 DIAGNOSIS — G3101 Pick's disease: Secondary | ICD-10-CM | POA: Diagnosis not present

## 2023-02-10 DIAGNOSIS — F0280 Dementia in other diseases classified elsewhere without behavioral disturbance: Secondary | ICD-10-CM | POA: Diagnosis not present

## 2023-02-10 DIAGNOSIS — Z87440 Personal history of urinary (tract) infections: Secondary | ICD-10-CM | POA: Diagnosis not present

## 2023-02-10 DIAGNOSIS — G40909 Epilepsy, unspecified, not intractable, without status epilepticus: Secondary | ICD-10-CM | POA: Diagnosis not present

## 2023-02-10 DIAGNOSIS — R32 Unspecified urinary incontinence: Secondary | ICD-10-CM | POA: Diagnosis not present

## 2023-02-11 DIAGNOSIS — F0280 Dementia in other diseases classified elsewhere without behavioral disturbance: Secondary | ICD-10-CM | POA: Diagnosis not present

## 2023-02-11 DIAGNOSIS — G40909 Epilepsy, unspecified, not intractable, without status epilepticus: Secondary | ICD-10-CM | POA: Diagnosis not present

## 2023-02-11 DIAGNOSIS — R32 Unspecified urinary incontinence: Secondary | ICD-10-CM | POA: Diagnosis not present

## 2023-02-11 DIAGNOSIS — R1310 Dysphagia, unspecified: Secondary | ICD-10-CM | POA: Diagnosis not present

## 2023-02-11 DIAGNOSIS — Z87440 Personal history of urinary (tract) infections: Secondary | ICD-10-CM | POA: Diagnosis not present

## 2023-02-11 DIAGNOSIS — G3101 Pick's disease: Secondary | ICD-10-CM | POA: Diagnosis not present

## 2023-02-12 DIAGNOSIS — Z87440 Personal history of urinary (tract) infections: Secondary | ICD-10-CM | POA: Diagnosis not present

## 2023-02-12 DIAGNOSIS — F0280 Dementia in other diseases classified elsewhere without behavioral disturbance: Secondary | ICD-10-CM | POA: Diagnosis not present

## 2023-02-12 DIAGNOSIS — G3101 Pick's disease: Secondary | ICD-10-CM | POA: Diagnosis not present

## 2023-02-12 DIAGNOSIS — R32 Unspecified urinary incontinence: Secondary | ICD-10-CM | POA: Diagnosis not present

## 2023-02-12 DIAGNOSIS — R1310 Dysphagia, unspecified: Secondary | ICD-10-CM | POA: Diagnosis not present

## 2023-02-12 DIAGNOSIS — G40909 Epilepsy, unspecified, not intractable, without status epilepticus: Secondary | ICD-10-CM | POA: Diagnosis not present

## 2023-02-13 DIAGNOSIS — Z87440 Personal history of urinary (tract) infections: Secondary | ICD-10-CM | POA: Diagnosis not present

## 2023-02-13 DIAGNOSIS — R1310 Dysphagia, unspecified: Secondary | ICD-10-CM | POA: Diagnosis not present

## 2023-02-13 DIAGNOSIS — G40909 Epilepsy, unspecified, not intractable, without status epilepticus: Secondary | ICD-10-CM | POA: Diagnosis not present

## 2023-02-13 DIAGNOSIS — R32 Unspecified urinary incontinence: Secondary | ICD-10-CM | POA: Diagnosis not present

## 2023-02-13 DIAGNOSIS — G3101 Pick's disease: Secondary | ICD-10-CM | POA: Diagnosis not present

## 2023-02-13 DIAGNOSIS — F0280 Dementia in other diseases classified elsewhere without behavioral disturbance: Secondary | ICD-10-CM | POA: Diagnosis not present

## 2023-02-14 DIAGNOSIS — G40909 Epilepsy, unspecified, not intractable, without status epilepticus: Secondary | ICD-10-CM | POA: Diagnosis not present

## 2023-02-14 DIAGNOSIS — R1310 Dysphagia, unspecified: Secondary | ICD-10-CM | POA: Diagnosis not present

## 2023-02-14 DIAGNOSIS — R32 Unspecified urinary incontinence: Secondary | ICD-10-CM | POA: Diagnosis not present

## 2023-02-14 DIAGNOSIS — F0280 Dementia in other diseases classified elsewhere without behavioral disturbance: Secondary | ICD-10-CM | POA: Diagnosis not present

## 2023-02-14 DIAGNOSIS — G3101 Pick's disease: Secondary | ICD-10-CM | POA: Diagnosis not present

## 2023-02-14 DIAGNOSIS — Z87440 Personal history of urinary (tract) infections: Secondary | ICD-10-CM | POA: Diagnosis not present

## 2023-02-15 DIAGNOSIS — R1310 Dysphagia, unspecified: Secondary | ICD-10-CM | POA: Diagnosis not present

## 2023-02-15 DIAGNOSIS — R32 Unspecified urinary incontinence: Secondary | ICD-10-CM | POA: Diagnosis not present

## 2023-02-15 DIAGNOSIS — Z87440 Personal history of urinary (tract) infections: Secondary | ICD-10-CM | POA: Diagnosis not present

## 2023-02-15 DIAGNOSIS — G3101 Pick's disease: Secondary | ICD-10-CM | POA: Diagnosis not present

## 2023-02-15 DIAGNOSIS — G40909 Epilepsy, unspecified, not intractable, without status epilepticus: Secondary | ICD-10-CM | POA: Diagnosis not present

## 2023-02-15 DIAGNOSIS — F0280 Dementia in other diseases classified elsewhere without behavioral disturbance: Secondary | ICD-10-CM | POA: Diagnosis not present

## 2023-02-16 DIAGNOSIS — F0280 Dementia in other diseases classified elsewhere without behavioral disturbance: Secondary | ICD-10-CM | POA: Diagnosis not present

## 2023-02-16 DIAGNOSIS — R32 Unspecified urinary incontinence: Secondary | ICD-10-CM | POA: Diagnosis not present

## 2023-02-16 DIAGNOSIS — G40909 Epilepsy, unspecified, not intractable, without status epilepticus: Secondary | ICD-10-CM | POA: Diagnosis not present

## 2023-02-16 DIAGNOSIS — R1310 Dysphagia, unspecified: Secondary | ICD-10-CM | POA: Diagnosis not present

## 2023-02-16 DIAGNOSIS — G3101 Pick's disease: Secondary | ICD-10-CM | POA: Diagnosis not present

## 2023-02-16 DIAGNOSIS — Z87440 Personal history of urinary (tract) infections: Secondary | ICD-10-CM | POA: Diagnosis not present

## 2023-02-17 DIAGNOSIS — G3101 Pick's disease: Secondary | ICD-10-CM | POA: Diagnosis not present

## 2023-02-17 DIAGNOSIS — R1310 Dysphagia, unspecified: Secondary | ICD-10-CM | POA: Diagnosis not present

## 2023-02-17 DIAGNOSIS — G40909 Epilepsy, unspecified, not intractable, without status epilepticus: Secondary | ICD-10-CM | POA: Diagnosis not present

## 2023-02-17 DIAGNOSIS — F0280 Dementia in other diseases classified elsewhere without behavioral disturbance: Secondary | ICD-10-CM | POA: Diagnosis not present

## 2023-02-17 DIAGNOSIS — Z87440 Personal history of urinary (tract) infections: Secondary | ICD-10-CM | POA: Diagnosis not present

## 2023-02-17 DIAGNOSIS — R32 Unspecified urinary incontinence: Secondary | ICD-10-CM | POA: Diagnosis not present

## 2023-02-18 DIAGNOSIS — G40909 Epilepsy, unspecified, not intractable, without status epilepticus: Secondary | ICD-10-CM | POA: Diagnosis not present

## 2023-02-18 DIAGNOSIS — Z87440 Personal history of urinary (tract) infections: Secondary | ICD-10-CM | POA: Diagnosis not present

## 2023-02-18 DIAGNOSIS — G3101 Pick's disease: Secondary | ICD-10-CM | POA: Diagnosis not present

## 2023-02-18 DIAGNOSIS — R1310 Dysphagia, unspecified: Secondary | ICD-10-CM | POA: Diagnosis not present

## 2023-02-18 DIAGNOSIS — R32 Unspecified urinary incontinence: Secondary | ICD-10-CM | POA: Diagnosis not present

## 2023-02-18 DIAGNOSIS — F0280 Dementia in other diseases classified elsewhere without behavioral disturbance: Secondary | ICD-10-CM | POA: Diagnosis not present

## 2023-02-19 ENCOUNTER — Encounter: Payer: Self-pay | Admitting: Neurology

## 2023-02-19 DIAGNOSIS — Z87440 Personal history of urinary (tract) infections: Secondary | ICD-10-CM | POA: Diagnosis not present

## 2023-02-19 DIAGNOSIS — R32 Unspecified urinary incontinence: Secondary | ICD-10-CM | POA: Diagnosis not present

## 2023-02-19 DIAGNOSIS — R1310 Dysphagia, unspecified: Secondary | ICD-10-CM | POA: Diagnosis not present

## 2023-02-19 DIAGNOSIS — F0280 Dementia in other diseases classified elsewhere without behavioral disturbance: Secondary | ICD-10-CM | POA: Diagnosis not present

## 2023-02-19 DIAGNOSIS — G3101 Pick's disease: Secondary | ICD-10-CM | POA: Diagnosis not present

## 2023-02-19 DIAGNOSIS — G40909 Epilepsy, unspecified, not intractable, without status epilepticus: Secondary | ICD-10-CM | POA: Diagnosis not present

## 2023-02-20 DIAGNOSIS — R1310 Dysphagia, unspecified: Secondary | ICD-10-CM | POA: Diagnosis not present

## 2023-02-20 DIAGNOSIS — G3101 Pick's disease: Secondary | ICD-10-CM | POA: Diagnosis not present

## 2023-02-20 DIAGNOSIS — R32 Unspecified urinary incontinence: Secondary | ICD-10-CM | POA: Diagnosis not present

## 2023-02-20 DIAGNOSIS — F0280 Dementia in other diseases classified elsewhere without behavioral disturbance: Secondary | ICD-10-CM | POA: Diagnosis not present

## 2023-02-20 DIAGNOSIS — Z87440 Personal history of urinary (tract) infections: Secondary | ICD-10-CM | POA: Diagnosis not present

## 2023-02-20 DIAGNOSIS — G40909 Epilepsy, unspecified, not intractable, without status epilepticus: Secondary | ICD-10-CM | POA: Diagnosis not present

## 2023-02-21 DIAGNOSIS — G40909 Epilepsy, unspecified, not intractable, without status epilepticus: Secondary | ICD-10-CM | POA: Diagnosis not present

## 2023-02-21 DIAGNOSIS — Z87440 Personal history of urinary (tract) infections: Secondary | ICD-10-CM | POA: Diagnosis not present

## 2023-02-21 DIAGNOSIS — F0280 Dementia in other diseases classified elsewhere without behavioral disturbance: Secondary | ICD-10-CM | POA: Diagnosis not present

## 2023-02-21 DIAGNOSIS — R1310 Dysphagia, unspecified: Secondary | ICD-10-CM | POA: Diagnosis not present

## 2023-02-21 DIAGNOSIS — R32 Unspecified urinary incontinence: Secondary | ICD-10-CM | POA: Diagnosis not present

## 2023-02-21 DIAGNOSIS — G3101 Pick's disease: Secondary | ICD-10-CM | POA: Diagnosis not present

## 2023-02-22 ENCOUNTER — Encounter: Payer: Self-pay | Admitting: Neurology

## 2023-02-22 DIAGNOSIS — R1310 Dysphagia, unspecified: Secondary | ICD-10-CM | POA: Diagnosis not present

## 2023-02-22 DIAGNOSIS — G40909 Epilepsy, unspecified, not intractable, without status epilepticus: Secondary | ICD-10-CM | POA: Diagnosis not present

## 2023-02-22 DIAGNOSIS — R32 Unspecified urinary incontinence: Secondary | ICD-10-CM | POA: Diagnosis not present

## 2023-02-22 DIAGNOSIS — G3101 Pick's disease: Secondary | ICD-10-CM | POA: Diagnosis not present

## 2023-02-22 DIAGNOSIS — Z87440 Personal history of urinary (tract) infections: Secondary | ICD-10-CM | POA: Diagnosis not present

## 2023-02-22 DIAGNOSIS — F0280 Dementia in other diseases classified elsewhere without behavioral disturbance: Secondary | ICD-10-CM | POA: Diagnosis not present

## 2023-02-23 DIAGNOSIS — Z87440 Personal history of urinary (tract) infections: Secondary | ICD-10-CM | POA: Diagnosis not present

## 2023-02-23 DIAGNOSIS — R1310 Dysphagia, unspecified: Secondary | ICD-10-CM | POA: Diagnosis not present

## 2023-02-23 DIAGNOSIS — G40909 Epilepsy, unspecified, not intractable, without status epilepticus: Secondary | ICD-10-CM | POA: Diagnosis not present

## 2023-02-23 DIAGNOSIS — G3101 Pick's disease: Secondary | ICD-10-CM | POA: Diagnosis not present

## 2023-02-23 DIAGNOSIS — F0280 Dementia in other diseases classified elsewhere without behavioral disturbance: Secondary | ICD-10-CM | POA: Diagnosis not present

## 2023-02-23 DIAGNOSIS — R32 Unspecified urinary incontinence: Secondary | ICD-10-CM | POA: Diagnosis not present

## 2023-02-23 NOTE — Progress Notes (Unsigned)
Virtual Visit via Video Note  Consent was obtained for video visit:  Yes.   Answered questions that patient had about telehealth interaction:  Yes.   I discussed the limitations, risks, security and privacy concerns of performing an evaluation and management service by telemedicine. I also discussed with the patient that there may be a patient responsible charge related to this service. The patient expressed understanding and agreed to proceed.  Pt location: Home Physician Location: office Name of referring provider:  [name], MD I connected with Michele Porter at patients initiation/request on 02/26/2023 at  2:50 PM EDT by video enabled telemedicine application and verified that I am speaking with the correct person using two identifiers. Pt MRN:  [id number] Pt DOB:  [birth year] Video Participants:  Michele Porter;    Assessment/Plan:   Primary progressive aphasia History of seizure   1  Seizure prophylaxis: Keppra 500mg  in morning and 1000mg  at night.  2.  For agitation:  quetiapine dose 25mg  in afternoon and 50mg  at night  3.  24 hour supervision 4.  Follow up in 6 months.      Subjective:    Michele Porter is a 72 year old right-handed woman with logopenic primary progressive aphasia and dementia who follows up for primary progressive aphasia.  I spoke with patient's husband, as Michele Porter is a poor historian.    UPDATE: Current medications: Seroquel 25mg  at lunch and 50mg  at dinner;  Keppra 500mg  (5 mL) in morning and 1000mg  (10 mL) at dinner   Last seizure:  09/29/2021.  Michele Porter continues to live at home with her husband who is her primary caregiver with help from CNAs from [facility].  She is under the home hospice care program of [facility] [place].  Appetite is good.  No problems with swallowing.  She sleeps well.  She will sometimes take an afternoon nap in her lift chair.  She is unable to stand unassisted.   She is  progressively more fatigued.  Ambulation continues to deteriorate, even with assistance.  She can walk very short distances, just a few steps, with assistance of two people.  She requires a wheelchair on the first floor.  She requires assistance of two people for all transfers into and out of the wheelchair, onto and up from the portable toilet, and in and out of the shower.  She uses a shower chair and showers three times a week.  She is completely dependent to perform activities of daily living, including bathing, using the toilet, and dressing.  She is incontinent to both bowel and bladder.  Quetiapine is helping her agitation.  She rarely becomes agitated.      HISTORY: Her husband began noticing problems with speech and language in 2015-2016, which has progressed.  At first, she would mispronounce words or phrases.  She then had difficulty carrying on a conversation as well as difficulty with reading.  MRI of brain with and without contrast from 04/16/15 showed mild cerebral atrophy but no mass lesions or acute intracranial process.  She then had trouble with numbers and trouble correctly punching the buttons on the microwave.  When she writes a check, she is able to write in the numbers but struggles spelling out the numbers.  She underwent neuropsychological testing on 07/12/15.  She demonstrated deficits of expressive language, repetition, confrontation naming, verbal fluency and comprehension, consistent with logopenic variant primary progressive aphasia.  She exhibited frequent phonemic paraphasias, circumlocution and slightly improved fluency of speech  during casual conversation versus response to specific or complex questions. She also met criteria for adjustment disorder with anxiety.  RPR nonreactive, B12 1056, TSH 1.020.   On the morning of 03/14/2019, she got out of bed and started talking to the bathroom mirror, which is not new.  Her husband found her on the floor convulsing with facial  twitching.  She was unresponsive.  She regained consciousness after about 10 minutes.  She was taken to [facility] ED for further evaluation.  Vitals were normal.  CT of head and cervical spine showed no acute abnormalities.  Labs, including CBC, troponin, electrolytes and troponin were unremarkable.  EKG was unremarkable.  She received a loading dose of Keppra and was discharged on 500mg  twice daily.  She has not had any recurrent seizure.   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  Her mother was diagnosed with dementia late in life and passed away in her 51s.  Her maternal aunt and cousin also had dementia.   Past medication:  Lexapro 10mg ; citalopram; Aricept (discontinued as it may lower seizure threshold); memantine (no longer able to swallow, likely no longer efficacious)  Past Medical History: Past Medical History:  Diagnosis Date   Dementia (HCC)    Seizures (HCC)     Medications: Outpatient Encounter Medications as of 02/26/2023  Medication Sig   acetaminophen (TYLENOL) 160 MG/5ML liquid Take by mouth every 4 (four) hours as needed for fever.   levETIRAcetam (KEPPRA) 100 MG/ML solution TAKE 5 ML IN MORNING AND 10 ML AT NIGHT.   QUEtiapine (SEROQUEL) 25 MG tablet TAKE 2 TABLETS BY MOUTH TWICE  DAILY   [DISCONTINUED] Multiple Vitamins-Minerals (MULTIVITAMIN PO) Take 1 tablet by mouth daily.   [DISCONTINUED] alendronate (FOSAMAX) 70 MG tablet Take 70 mg by mouth once a week. Take with a full glass of water on an empty stomach.   [DISCONTINUED] CALCIUM PO Take 1 tablet by mouth daily.   [DISCONTINUED] citalopram (CELEXA) 20 MG tablet Take 1 tablet (20 mg total) by mouth daily. (Patient not taking: Reported on 01/22/2020)   [DISCONTINUED] donepezil (ARICEPT) 10 MG tablet TAKE 1 TABLET BY MOUTH AT  BEDTIME (Patient not taking: Reported on 01/22/2020)   [DISCONTINUED] escitalopram (LEXAPRO) 10 MG  tablet Take 1 tablet (10 mg total) by mouth daily.   [DISCONTINUED] memantine (NAMENDA) 10 MG tablet TAKE 1 TABLET BY MOUTH  TWICE DAILY   [DISCONTINUED] Omega-3 Fatty Acids (FISH OIL PO) Take 1 capsule by mouth daily.   No facility-administered encounter medications on file as of 02/26/2023.    Allergies: Allergies  Allergen Reactions   Sulfa Antibiotics     Family History: Family History  Problem Relation Age of Onset   Dementia Mother    Pancreatic cancer Father     Observations/Objective:   No acute distress.  Alert and oriented.  Speech fluent and not dysarthric.  Language intact.  Eyes orthophoric on primary gaze.  Face symmetric.   Follow Up Instructions:    -I discussed the assessment and treatment plan with the patient. The patient was provided an opportunity to ask questions and all were answered. The patient agreed with the plan and demonstrated an understanding of the instructions.   The patient was advised to call back or seek an in-person evaluation if the symptoms worsen or if the condition fails to improve as anticipated.   Cira Servant, DO

## 2023-02-24 DIAGNOSIS — R1310 Dysphagia, unspecified: Secondary | ICD-10-CM | POA: Diagnosis not present

## 2023-02-24 DIAGNOSIS — F0280 Dementia in other diseases classified elsewhere without behavioral disturbance: Secondary | ICD-10-CM | POA: Diagnosis not present

## 2023-02-24 DIAGNOSIS — G3101 Pick's disease: Secondary | ICD-10-CM | POA: Diagnosis not present

## 2023-02-24 DIAGNOSIS — Z87440 Personal history of urinary (tract) infections: Secondary | ICD-10-CM | POA: Diagnosis not present

## 2023-02-24 DIAGNOSIS — G40909 Epilepsy, unspecified, not intractable, without status epilepticus: Secondary | ICD-10-CM | POA: Diagnosis not present

## 2023-02-24 DIAGNOSIS — R32 Unspecified urinary incontinence: Secondary | ICD-10-CM | POA: Diagnosis not present

## 2023-02-25 DIAGNOSIS — G3101 Pick's disease: Secondary | ICD-10-CM | POA: Diagnosis not present

## 2023-02-25 DIAGNOSIS — F0280 Dementia in other diseases classified elsewhere without behavioral disturbance: Secondary | ICD-10-CM | POA: Diagnosis not present

## 2023-02-25 DIAGNOSIS — G40909 Epilepsy, unspecified, not intractable, without status epilepticus: Secondary | ICD-10-CM | POA: Diagnosis not present

## 2023-02-25 DIAGNOSIS — R32 Unspecified urinary incontinence: Secondary | ICD-10-CM | POA: Diagnosis not present

## 2023-02-25 DIAGNOSIS — Z87440 Personal history of urinary (tract) infections: Secondary | ICD-10-CM | POA: Diagnosis not present

## 2023-02-25 DIAGNOSIS — R1310 Dysphagia, unspecified: Secondary | ICD-10-CM | POA: Diagnosis not present

## 2023-02-26 ENCOUNTER — Telehealth (INDEPENDENT_AMBULATORY_CARE_PROVIDER_SITE_OTHER): Payer: Medicare Other | Admitting: Neurology

## 2023-02-26 ENCOUNTER — Encounter: Payer: Self-pay | Admitting: Neurology

## 2023-02-26 DIAGNOSIS — G3101 Pick's disease: Secondary | ICD-10-CM

## 2023-02-26 DIAGNOSIS — G40909 Epilepsy, unspecified, not intractable, without status epilepticus: Secondary | ICD-10-CM | POA: Diagnosis not present

## 2023-02-26 DIAGNOSIS — F0280 Dementia in other diseases classified elsewhere without behavioral disturbance: Secondary | ICD-10-CM | POA: Diagnosis not present

## 2023-02-26 DIAGNOSIS — Z8669 Personal history of other diseases of the nervous system and sense organs: Secondary | ICD-10-CM | POA: Diagnosis not present

## 2023-02-26 DIAGNOSIS — Z87440 Personal history of urinary (tract) infections: Secondary | ICD-10-CM | POA: Diagnosis not present

## 2023-02-26 DIAGNOSIS — R1310 Dysphagia, unspecified: Secondary | ICD-10-CM | POA: Diagnosis not present

## 2023-02-26 DIAGNOSIS — R32 Unspecified urinary incontinence: Secondary | ICD-10-CM | POA: Diagnosis not present

## 2023-02-27 DIAGNOSIS — G3101 Pick's disease: Secondary | ICD-10-CM | POA: Diagnosis not present

## 2023-02-27 DIAGNOSIS — R1310 Dysphagia, unspecified: Secondary | ICD-10-CM | POA: Diagnosis not present

## 2023-02-27 DIAGNOSIS — G40909 Epilepsy, unspecified, not intractable, without status epilepticus: Secondary | ICD-10-CM | POA: Diagnosis not present

## 2023-02-27 DIAGNOSIS — Z87440 Personal history of urinary (tract) infections: Secondary | ICD-10-CM | POA: Diagnosis not present

## 2023-02-27 DIAGNOSIS — R32 Unspecified urinary incontinence: Secondary | ICD-10-CM | POA: Diagnosis not present

## 2023-02-27 DIAGNOSIS — F0280 Dementia in other diseases classified elsewhere without behavioral disturbance: Secondary | ICD-10-CM | POA: Diagnosis not present

## 2023-02-28 DIAGNOSIS — R1310 Dysphagia, unspecified: Secondary | ICD-10-CM | POA: Diagnosis not present

## 2023-02-28 DIAGNOSIS — G3101 Pick's disease: Secondary | ICD-10-CM | POA: Diagnosis not present

## 2023-02-28 DIAGNOSIS — R32 Unspecified urinary incontinence: Secondary | ICD-10-CM | POA: Diagnosis not present

## 2023-02-28 DIAGNOSIS — Z87440 Personal history of urinary (tract) infections: Secondary | ICD-10-CM | POA: Diagnosis not present

## 2023-02-28 DIAGNOSIS — G40909 Epilepsy, unspecified, not intractable, without status epilepticus: Secondary | ICD-10-CM | POA: Diagnosis not present

## 2023-02-28 DIAGNOSIS — F0280 Dementia in other diseases classified elsewhere without behavioral disturbance: Secondary | ICD-10-CM | POA: Diagnosis not present

## 2023-03-01 DIAGNOSIS — R1310 Dysphagia, unspecified: Secondary | ICD-10-CM | POA: Diagnosis not present

## 2023-03-01 DIAGNOSIS — R32 Unspecified urinary incontinence: Secondary | ICD-10-CM | POA: Diagnosis not present

## 2023-03-01 DIAGNOSIS — Z87440 Personal history of urinary (tract) infections: Secondary | ICD-10-CM | POA: Diagnosis not present

## 2023-03-01 DIAGNOSIS — G3101 Pick's disease: Secondary | ICD-10-CM | POA: Diagnosis not present

## 2023-03-01 DIAGNOSIS — G40909 Epilepsy, unspecified, not intractable, without status epilepticus: Secondary | ICD-10-CM | POA: Diagnosis not present

## 2023-03-01 DIAGNOSIS — F0280 Dementia in other diseases classified elsewhere without behavioral disturbance: Secondary | ICD-10-CM | POA: Diagnosis not present

## 2023-03-02 DIAGNOSIS — R32 Unspecified urinary incontinence: Secondary | ICD-10-CM | POA: Diagnosis not present

## 2023-03-02 DIAGNOSIS — G40909 Epilepsy, unspecified, not intractable, without status epilepticus: Secondary | ICD-10-CM | POA: Diagnosis not present

## 2023-03-02 DIAGNOSIS — R1310 Dysphagia, unspecified: Secondary | ICD-10-CM | POA: Diagnosis not present

## 2023-03-02 DIAGNOSIS — Z87440 Personal history of urinary (tract) infections: Secondary | ICD-10-CM | POA: Diagnosis not present

## 2023-03-02 DIAGNOSIS — F0280 Dementia in other diseases classified elsewhere without behavioral disturbance: Secondary | ICD-10-CM | POA: Diagnosis not present

## 2023-03-02 DIAGNOSIS — G3101 Pick's disease: Secondary | ICD-10-CM | POA: Diagnosis not present

## 2023-03-03 DIAGNOSIS — Z87440 Personal history of urinary (tract) infections: Secondary | ICD-10-CM | POA: Diagnosis not present

## 2023-03-03 DIAGNOSIS — R1310 Dysphagia, unspecified: Secondary | ICD-10-CM | POA: Diagnosis not present

## 2023-03-03 DIAGNOSIS — G3101 Pick's disease: Secondary | ICD-10-CM | POA: Diagnosis not present

## 2023-03-03 DIAGNOSIS — F0280 Dementia in other diseases classified elsewhere without behavioral disturbance: Secondary | ICD-10-CM | POA: Diagnosis not present

## 2023-03-03 DIAGNOSIS — G40909 Epilepsy, unspecified, not intractable, without status epilepticus: Secondary | ICD-10-CM | POA: Diagnosis not present

## 2023-03-03 DIAGNOSIS — R32 Unspecified urinary incontinence: Secondary | ICD-10-CM | POA: Diagnosis not present

## 2023-03-04 DIAGNOSIS — Z87440 Personal history of urinary (tract) infections: Secondary | ICD-10-CM | POA: Diagnosis not present

## 2023-03-04 DIAGNOSIS — R1310 Dysphagia, unspecified: Secondary | ICD-10-CM | POA: Diagnosis not present

## 2023-03-04 DIAGNOSIS — G40909 Epilepsy, unspecified, not intractable, without status epilepticus: Secondary | ICD-10-CM | POA: Diagnosis not present

## 2023-03-04 DIAGNOSIS — F0280 Dementia in other diseases classified elsewhere without behavioral disturbance: Secondary | ICD-10-CM | POA: Diagnosis not present

## 2023-03-04 DIAGNOSIS — R32 Unspecified urinary incontinence: Secondary | ICD-10-CM | POA: Diagnosis not present

## 2023-03-04 DIAGNOSIS — G3101 Pick's disease: Secondary | ICD-10-CM | POA: Diagnosis not present

## 2023-03-05 DIAGNOSIS — F0280 Dementia in other diseases classified elsewhere without behavioral disturbance: Secondary | ICD-10-CM | POA: Diagnosis not present

## 2023-03-05 DIAGNOSIS — R32 Unspecified urinary incontinence: Secondary | ICD-10-CM | POA: Diagnosis not present

## 2023-03-05 DIAGNOSIS — Z87440 Personal history of urinary (tract) infections: Secondary | ICD-10-CM | POA: Diagnosis not present

## 2023-03-05 DIAGNOSIS — G3101 Pick's disease: Secondary | ICD-10-CM | POA: Diagnosis not present

## 2023-03-05 DIAGNOSIS — G40909 Epilepsy, unspecified, not intractable, without status epilepticus: Secondary | ICD-10-CM | POA: Diagnosis not present

## 2023-03-05 DIAGNOSIS — R1310 Dysphagia, unspecified: Secondary | ICD-10-CM | POA: Diagnosis not present

## 2023-03-06 DIAGNOSIS — R32 Unspecified urinary incontinence: Secondary | ICD-10-CM | POA: Diagnosis not present

## 2023-03-06 DIAGNOSIS — G3101 Pick's disease: Secondary | ICD-10-CM | POA: Diagnosis not present

## 2023-03-06 DIAGNOSIS — Z87440 Personal history of urinary (tract) infections: Secondary | ICD-10-CM | POA: Diagnosis not present

## 2023-03-06 DIAGNOSIS — R1310 Dysphagia, unspecified: Secondary | ICD-10-CM | POA: Diagnosis not present

## 2023-03-06 DIAGNOSIS — G40909 Epilepsy, unspecified, not intractable, without status epilepticus: Secondary | ICD-10-CM | POA: Diagnosis not present

## 2023-03-06 DIAGNOSIS — F0280 Dementia in other diseases classified elsewhere without behavioral disturbance: Secondary | ICD-10-CM | POA: Diagnosis not present

## 2023-03-07 DIAGNOSIS — R32 Unspecified urinary incontinence: Secondary | ICD-10-CM | POA: Diagnosis not present

## 2023-03-07 DIAGNOSIS — F0280 Dementia in other diseases classified elsewhere without behavioral disturbance: Secondary | ICD-10-CM | POA: Diagnosis not present

## 2023-03-07 DIAGNOSIS — Z87440 Personal history of urinary (tract) infections: Secondary | ICD-10-CM | POA: Diagnosis not present

## 2023-03-07 DIAGNOSIS — G40909 Epilepsy, unspecified, not intractable, without status epilepticus: Secondary | ICD-10-CM | POA: Diagnosis not present

## 2023-03-07 DIAGNOSIS — G3101 Pick's disease: Secondary | ICD-10-CM | POA: Diagnosis not present

## 2023-03-07 DIAGNOSIS — R1310 Dysphagia, unspecified: Secondary | ICD-10-CM | POA: Diagnosis not present

## 2023-03-08 DIAGNOSIS — F0280 Dementia in other diseases classified elsewhere without behavioral disturbance: Secondary | ICD-10-CM | POA: Diagnosis not present

## 2023-03-08 DIAGNOSIS — Z87440 Personal history of urinary (tract) infections: Secondary | ICD-10-CM | POA: Diagnosis not present

## 2023-03-08 DIAGNOSIS — R32 Unspecified urinary incontinence: Secondary | ICD-10-CM | POA: Diagnosis not present

## 2023-03-08 DIAGNOSIS — G3101 Pick's disease: Secondary | ICD-10-CM | POA: Diagnosis not present

## 2023-03-08 DIAGNOSIS — G40909 Epilepsy, unspecified, not intractable, without status epilepticus: Secondary | ICD-10-CM | POA: Diagnosis not present

## 2023-03-08 DIAGNOSIS — R1310 Dysphagia, unspecified: Secondary | ICD-10-CM | POA: Diagnosis not present

## 2023-03-09 DIAGNOSIS — Z87440 Personal history of urinary (tract) infections: Secondary | ICD-10-CM | POA: Diagnosis not present

## 2023-03-09 DIAGNOSIS — G40909 Epilepsy, unspecified, not intractable, without status epilepticus: Secondary | ICD-10-CM | POA: Diagnosis not present

## 2023-03-09 DIAGNOSIS — F0280 Dementia in other diseases classified elsewhere without behavioral disturbance: Secondary | ICD-10-CM | POA: Diagnosis not present

## 2023-03-09 DIAGNOSIS — R1310 Dysphagia, unspecified: Secondary | ICD-10-CM | POA: Diagnosis not present

## 2023-03-09 DIAGNOSIS — Z741 Need for assistance with personal care: Secondary | ICD-10-CM | POA: Diagnosis not present

## 2023-03-09 DIAGNOSIS — G3101 Pick's disease: Secondary | ICD-10-CM | POA: Diagnosis not present

## 2023-03-09 DIAGNOSIS — R32 Unspecified urinary incontinence: Secondary | ICD-10-CM | POA: Diagnosis not present

## 2023-03-09 DIAGNOSIS — R159 Full incontinence of feces: Secondary | ICD-10-CM | POA: Diagnosis not present

## 2023-03-13 DIAGNOSIS — Z87440 Personal history of urinary (tract) infections: Secondary | ICD-10-CM | POA: Diagnosis not present

## 2023-03-13 DIAGNOSIS — R32 Unspecified urinary incontinence: Secondary | ICD-10-CM | POA: Diagnosis not present

## 2023-03-13 DIAGNOSIS — R1310 Dysphagia, unspecified: Secondary | ICD-10-CM | POA: Diagnosis not present

## 2023-03-13 DIAGNOSIS — F0280 Dementia in other diseases classified elsewhere without behavioral disturbance: Secondary | ICD-10-CM | POA: Diagnosis not present

## 2023-03-13 DIAGNOSIS — G40909 Epilepsy, unspecified, not intractable, without status epilepticus: Secondary | ICD-10-CM | POA: Diagnosis not present

## 2023-03-13 DIAGNOSIS — G3101 Pick's disease: Secondary | ICD-10-CM | POA: Diagnosis not present

## 2023-03-20 DIAGNOSIS — Z87440 Personal history of urinary (tract) infections: Secondary | ICD-10-CM | POA: Diagnosis not present

## 2023-03-20 DIAGNOSIS — G40909 Epilepsy, unspecified, not intractable, without status epilepticus: Secondary | ICD-10-CM | POA: Diagnosis not present

## 2023-03-20 DIAGNOSIS — G3101 Pick's disease: Secondary | ICD-10-CM | POA: Diagnosis not present

## 2023-03-20 DIAGNOSIS — R32 Unspecified urinary incontinence: Secondary | ICD-10-CM | POA: Diagnosis not present

## 2023-03-20 DIAGNOSIS — F0280 Dementia in other diseases classified elsewhere without behavioral disturbance: Secondary | ICD-10-CM | POA: Diagnosis not present

## 2023-03-20 DIAGNOSIS — R1310 Dysphagia, unspecified: Secondary | ICD-10-CM | POA: Diagnosis not present

## 2023-03-27 DIAGNOSIS — Z87440 Personal history of urinary (tract) infections: Secondary | ICD-10-CM | POA: Diagnosis not present

## 2023-03-27 DIAGNOSIS — R1310 Dysphagia, unspecified: Secondary | ICD-10-CM | POA: Diagnosis not present

## 2023-03-27 DIAGNOSIS — R32 Unspecified urinary incontinence: Secondary | ICD-10-CM | POA: Diagnosis not present

## 2023-03-27 DIAGNOSIS — G3101 Pick's disease: Secondary | ICD-10-CM | POA: Diagnosis not present

## 2023-03-27 DIAGNOSIS — G40909 Epilepsy, unspecified, not intractable, without status epilepticus: Secondary | ICD-10-CM | POA: Diagnosis not present

## 2023-03-27 DIAGNOSIS — F0280 Dementia in other diseases classified elsewhere without behavioral disturbance: Secondary | ICD-10-CM | POA: Diagnosis not present

## 2023-03-28 DIAGNOSIS — G3101 Pick's disease: Secondary | ICD-10-CM | POA: Diagnosis not present

## 2023-03-28 DIAGNOSIS — Z87440 Personal history of urinary (tract) infections: Secondary | ICD-10-CM | POA: Diagnosis not present

## 2023-03-28 DIAGNOSIS — R1310 Dysphagia, unspecified: Secondary | ICD-10-CM | POA: Diagnosis not present

## 2023-03-28 DIAGNOSIS — G40909 Epilepsy, unspecified, not intractable, without status epilepticus: Secondary | ICD-10-CM | POA: Diagnosis not present

## 2023-03-28 DIAGNOSIS — R32 Unspecified urinary incontinence: Secondary | ICD-10-CM | POA: Diagnosis not present

## 2023-03-28 DIAGNOSIS — F0280 Dementia in other diseases classified elsewhere without behavioral disturbance: Secondary | ICD-10-CM | POA: Diagnosis not present

## 2023-04-03 DIAGNOSIS — G40909 Epilepsy, unspecified, not intractable, without status epilepticus: Secondary | ICD-10-CM | POA: Diagnosis not present

## 2023-04-03 DIAGNOSIS — Z87440 Personal history of urinary (tract) infections: Secondary | ICD-10-CM | POA: Diagnosis not present

## 2023-04-03 DIAGNOSIS — R32 Unspecified urinary incontinence: Secondary | ICD-10-CM | POA: Diagnosis not present

## 2023-04-03 DIAGNOSIS — R1310 Dysphagia, unspecified: Secondary | ICD-10-CM | POA: Diagnosis not present

## 2023-04-03 DIAGNOSIS — F0280 Dementia in other diseases classified elsewhere without behavioral disturbance: Secondary | ICD-10-CM | POA: Diagnosis not present

## 2023-04-03 DIAGNOSIS — G3101 Pick's disease: Secondary | ICD-10-CM | POA: Diagnosis not present

## 2023-04-08 DIAGNOSIS — R159 Full incontinence of feces: Secondary | ICD-10-CM | POA: Diagnosis not present

## 2023-04-08 DIAGNOSIS — Z87440 Personal history of urinary (tract) infections: Secondary | ICD-10-CM | POA: Diagnosis not present

## 2023-04-08 DIAGNOSIS — F0280 Dementia in other diseases classified elsewhere without behavioral disturbance: Secondary | ICD-10-CM | POA: Diagnosis not present

## 2023-04-08 DIAGNOSIS — G40909 Epilepsy, unspecified, not intractable, without status epilepticus: Secondary | ICD-10-CM | POA: Diagnosis not present

## 2023-04-08 DIAGNOSIS — G3101 Pick's disease: Secondary | ICD-10-CM | POA: Diagnosis not present

## 2023-04-08 DIAGNOSIS — Z741 Need for assistance with personal care: Secondary | ICD-10-CM | POA: Diagnosis not present

## 2023-04-08 DIAGNOSIS — R1310 Dysphagia, unspecified: Secondary | ICD-10-CM | POA: Diagnosis not present

## 2023-04-08 DIAGNOSIS — R32 Unspecified urinary incontinence: Secondary | ICD-10-CM | POA: Diagnosis not present

## 2023-04-10 DIAGNOSIS — R32 Unspecified urinary incontinence: Secondary | ICD-10-CM | POA: Diagnosis not present

## 2023-04-10 DIAGNOSIS — R1310 Dysphagia, unspecified: Secondary | ICD-10-CM | POA: Diagnosis not present

## 2023-04-10 DIAGNOSIS — Z87440 Personal history of urinary (tract) infections: Secondary | ICD-10-CM | POA: Diagnosis not present

## 2023-04-10 DIAGNOSIS — G40909 Epilepsy, unspecified, not intractable, without status epilepticus: Secondary | ICD-10-CM | POA: Diagnosis not present

## 2023-04-10 DIAGNOSIS — F0280 Dementia in other diseases classified elsewhere without behavioral disturbance: Secondary | ICD-10-CM | POA: Diagnosis not present

## 2023-04-10 DIAGNOSIS — G3101 Pick's disease: Secondary | ICD-10-CM | POA: Diagnosis not present

## 2023-04-17 DIAGNOSIS — R32 Unspecified urinary incontinence: Secondary | ICD-10-CM | POA: Diagnosis not present

## 2023-04-17 DIAGNOSIS — G3101 Pick's disease: Secondary | ICD-10-CM | POA: Diagnosis not present

## 2023-04-17 DIAGNOSIS — G40909 Epilepsy, unspecified, not intractable, without status epilepticus: Secondary | ICD-10-CM | POA: Diagnosis not present

## 2023-04-17 DIAGNOSIS — R1310 Dysphagia, unspecified: Secondary | ICD-10-CM | POA: Diagnosis not present

## 2023-04-17 DIAGNOSIS — Z87440 Personal history of urinary (tract) infections: Secondary | ICD-10-CM | POA: Diagnosis not present

## 2023-04-17 DIAGNOSIS — F0280 Dementia in other diseases classified elsewhere without behavioral disturbance: Secondary | ICD-10-CM | POA: Diagnosis not present

## 2023-04-24 DIAGNOSIS — G3101 Pick's disease: Secondary | ICD-10-CM | POA: Diagnosis not present

## 2023-04-24 DIAGNOSIS — F0280 Dementia in other diseases classified elsewhere without behavioral disturbance: Secondary | ICD-10-CM | POA: Diagnosis not present

## 2023-04-24 DIAGNOSIS — R32 Unspecified urinary incontinence: Secondary | ICD-10-CM | POA: Diagnosis not present

## 2023-04-24 DIAGNOSIS — G40909 Epilepsy, unspecified, not intractable, without status epilepticus: Secondary | ICD-10-CM | POA: Diagnosis not present

## 2023-04-24 DIAGNOSIS — Z87440 Personal history of urinary (tract) infections: Secondary | ICD-10-CM | POA: Diagnosis not present

## 2023-04-24 DIAGNOSIS — R1310 Dysphagia, unspecified: Secondary | ICD-10-CM | POA: Diagnosis not present

## 2023-05-01 DIAGNOSIS — R32 Unspecified urinary incontinence: Secondary | ICD-10-CM | POA: Diagnosis not present

## 2023-05-01 DIAGNOSIS — G3101 Pick's disease: Secondary | ICD-10-CM | POA: Diagnosis not present

## 2023-05-01 DIAGNOSIS — G40909 Epilepsy, unspecified, not intractable, without status epilepticus: Secondary | ICD-10-CM | POA: Diagnosis not present

## 2023-05-01 DIAGNOSIS — R1310 Dysphagia, unspecified: Secondary | ICD-10-CM | POA: Diagnosis not present

## 2023-05-01 DIAGNOSIS — F0280 Dementia in other diseases classified elsewhere without behavioral disturbance: Secondary | ICD-10-CM | POA: Diagnosis not present

## 2023-05-01 DIAGNOSIS — Z87440 Personal history of urinary (tract) infections: Secondary | ICD-10-CM | POA: Diagnosis not present

## 2023-05-03 DIAGNOSIS — R1310 Dysphagia, unspecified: Secondary | ICD-10-CM | POA: Diagnosis not present

## 2023-05-03 DIAGNOSIS — G3101 Pick's disease: Secondary | ICD-10-CM | POA: Diagnosis not present

## 2023-05-03 DIAGNOSIS — G40909 Epilepsy, unspecified, not intractable, without status epilepticus: Secondary | ICD-10-CM | POA: Diagnosis not present

## 2023-05-03 DIAGNOSIS — Z87440 Personal history of urinary (tract) infections: Secondary | ICD-10-CM | POA: Diagnosis not present

## 2023-05-03 DIAGNOSIS — R32 Unspecified urinary incontinence: Secondary | ICD-10-CM | POA: Diagnosis not present

## 2023-05-03 DIAGNOSIS — F0280 Dementia in other diseases classified elsewhere without behavioral disturbance: Secondary | ICD-10-CM | POA: Diagnosis not present

## 2023-05-08 DIAGNOSIS — R32 Unspecified urinary incontinence: Secondary | ICD-10-CM | POA: Diagnosis not present

## 2023-05-08 DIAGNOSIS — R1310 Dysphagia, unspecified: Secondary | ICD-10-CM | POA: Diagnosis not present

## 2023-05-08 DIAGNOSIS — G3101 Pick's disease: Secondary | ICD-10-CM | POA: Diagnosis not present

## 2023-05-08 DIAGNOSIS — F0280 Dementia in other diseases classified elsewhere without behavioral disturbance: Secondary | ICD-10-CM | POA: Diagnosis not present

## 2023-05-08 DIAGNOSIS — Z87440 Personal history of urinary (tract) infections: Secondary | ICD-10-CM | POA: Diagnosis not present

## 2023-05-08 DIAGNOSIS — G40909 Epilepsy, unspecified, not intractable, without status epilepticus: Secondary | ICD-10-CM | POA: Diagnosis not present

## 2023-05-09 DIAGNOSIS — F0280 Dementia in other diseases classified elsewhere without behavioral disturbance: Secondary | ICD-10-CM | POA: Diagnosis not present

## 2023-05-09 DIAGNOSIS — G3101 Pick's disease: Secondary | ICD-10-CM | POA: Diagnosis not present

## 2023-05-09 DIAGNOSIS — Z87440 Personal history of urinary (tract) infections: Secondary | ICD-10-CM | POA: Diagnosis not present

## 2023-05-09 DIAGNOSIS — R1310 Dysphagia, unspecified: Secondary | ICD-10-CM | POA: Diagnosis not present

## 2023-05-09 DIAGNOSIS — R159 Full incontinence of feces: Secondary | ICD-10-CM | POA: Diagnosis not present

## 2023-05-09 DIAGNOSIS — G40909 Epilepsy, unspecified, not intractable, without status epilepticus: Secondary | ICD-10-CM | POA: Diagnosis not present

## 2023-05-09 DIAGNOSIS — R32 Unspecified urinary incontinence: Secondary | ICD-10-CM | POA: Diagnosis not present

## 2023-05-09 DIAGNOSIS — Z741 Need for assistance with personal care: Secondary | ICD-10-CM | POA: Diagnosis not present

## 2023-05-15 DIAGNOSIS — G3101 Pick's disease: Secondary | ICD-10-CM | POA: Diagnosis not present

## 2023-05-15 DIAGNOSIS — Z87440 Personal history of urinary (tract) infections: Secondary | ICD-10-CM | POA: Diagnosis not present

## 2023-05-15 DIAGNOSIS — R1310 Dysphagia, unspecified: Secondary | ICD-10-CM | POA: Diagnosis not present

## 2023-05-15 DIAGNOSIS — R32 Unspecified urinary incontinence: Secondary | ICD-10-CM | POA: Diagnosis not present

## 2023-05-15 DIAGNOSIS — F0280 Dementia in other diseases classified elsewhere without behavioral disturbance: Secondary | ICD-10-CM | POA: Diagnosis not present

## 2023-05-15 DIAGNOSIS — G40909 Epilepsy, unspecified, not intractable, without status epilepticus: Secondary | ICD-10-CM | POA: Diagnosis not present

## 2023-05-22 DIAGNOSIS — R1310 Dysphagia, unspecified: Secondary | ICD-10-CM | POA: Diagnosis not present

## 2023-05-22 DIAGNOSIS — G40909 Epilepsy, unspecified, not intractable, without status epilepticus: Secondary | ICD-10-CM | POA: Diagnosis not present

## 2023-05-22 DIAGNOSIS — R32 Unspecified urinary incontinence: Secondary | ICD-10-CM | POA: Diagnosis not present

## 2023-05-22 DIAGNOSIS — G3101 Pick's disease: Secondary | ICD-10-CM | POA: Diagnosis not present

## 2023-05-22 DIAGNOSIS — F0280 Dementia in other diseases classified elsewhere without behavioral disturbance: Secondary | ICD-10-CM | POA: Diagnosis not present

## 2023-05-22 DIAGNOSIS — Z87440 Personal history of urinary (tract) infections: Secondary | ICD-10-CM | POA: Diagnosis not present

## 2023-05-29 DIAGNOSIS — Z87440 Personal history of urinary (tract) infections: Secondary | ICD-10-CM | POA: Diagnosis not present

## 2023-05-29 DIAGNOSIS — R1310 Dysphagia, unspecified: Secondary | ICD-10-CM | POA: Diagnosis not present

## 2023-05-29 DIAGNOSIS — G40909 Epilepsy, unspecified, not intractable, without status epilepticus: Secondary | ICD-10-CM | POA: Diagnosis not present

## 2023-05-29 DIAGNOSIS — R32 Unspecified urinary incontinence: Secondary | ICD-10-CM | POA: Diagnosis not present

## 2023-05-29 DIAGNOSIS — F0280 Dementia in other diseases classified elsewhere without behavioral disturbance: Secondary | ICD-10-CM | POA: Diagnosis not present

## 2023-05-29 DIAGNOSIS — G3101 Pick's disease: Secondary | ICD-10-CM | POA: Diagnosis not present

## 2023-05-30 ENCOUNTER — Encounter: Payer: Self-pay | Admitting: Neurology

## 2023-05-31 DIAGNOSIS — R1310 Dysphagia, unspecified: Secondary | ICD-10-CM | POA: Diagnosis not present

## 2023-05-31 DIAGNOSIS — Z87440 Personal history of urinary (tract) infections: Secondary | ICD-10-CM | POA: Diagnosis not present

## 2023-05-31 DIAGNOSIS — G40909 Epilepsy, unspecified, not intractable, without status epilepticus: Secondary | ICD-10-CM | POA: Diagnosis not present

## 2023-05-31 DIAGNOSIS — R32 Unspecified urinary incontinence: Secondary | ICD-10-CM | POA: Diagnosis not present

## 2023-05-31 DIAGNOSIS — G3101 Pick's disease: Secondary | ICD-10-CM | POA: Diagnosis not present

## 2023-05-31 DIAGNOSIS — F0280 Dementia in other diseases classified elsewhere without behavioral disturbance: Secondary | ICD-10-CM | POA: Diagnosis not present

## 2023-06-02 DIAGNOSIS — G3101 Pick's disease: Secondary | ICD-10-CM | POA: Diagnosis not present

## 2023-06-02 DIAGNOSIS — F0280 Dementia in other diseases classified elsewhere without behavioral disturbance: Secondary | ICD-10-CM | POA: Diagnosis not present

## 2023-06-02 DIAGNOSIS — R1310 Dysphagia, unspecified: Secondary | ICD-10-CM | POA: Diagnosis not present

## 2023-06-02 DIAGNOSIS — Z87440 Personal history of urinary (tract) infections: Secondary | ICD-10-CM | POA: Diagnosis not present

## 2023-06-02 DIAGNOSIS — G40909 Epilepsy, unspecified, not intractable, without status epilepticus: Secondary | ICD-10-CM | POA: Diagnosis not present

## 2023-06-02 DIAGNOSIS — R32 Unspecified urinary incontinence: Secondary | ICD-10-CM | POA: Diagnosis not present

## 2023-06-05 DIAGNOSIS — F0280 Dementia in other diseases classified elsewhere without behavioral disturbance: Secondary | ICD-10-CM | POA: Diagnosis not present

## 2023-06-05 DIAGNOSIS — G3101 Pick's disease: Secondary | ICD-10-CM | POA: Diagnosis not present

## 2023-06-05 DIAGNOSIS — Z87440 Personal history of urinary (tract) infections: Secondary | ICD-10-CM | POA: Diagnosis not present

## 2023-06-05 DIAGNOSIS — G40909 Epilepsy, unspecified, not intractable, without status epilepticus: Secondary | ICD-10-CM | POA: Diagnosis not present

## 2023-06-05 DIAGNOSIS — R32 Unspecified urinary incontinence: Secondary | ICD-10-CM | POA: Diagnosis not present

## 2023-06-05 DIAGNOSIS — R1310 Dysphagia, unspecified: Secondary | ICD-10-CM | POA: Diagnosis not present

## 2023-06-09 DIAGNOSIS — R1310 Dysphagia, unspecified: Secondary | ICD-10-CM | POA: Diagnosis not present

## 2023-06-09 DIAGNOSIS — Z87440 Personal history of urinary (tract) infections: Secondary | ICD-10-CM | POA: Diagnosis not present

## 2023-06-09 DIAGNOSIS — Z741 Need for assistance with personal care: Secondary | ICD-10-CM | POA: Diagnosis not present

## 2023-06-09 DIAGNOSIS — G40909 Epilepsy, unspecified, not intractable, without status epilepticus: Secondary | ICD-10-CM | POA: Diagnosis not present

## 2023-06-09 DIAGNOSIS — R32 Unspecified urinary incontinence: Secondary | ICD-10-CM | POA: Diagnosis not present

## 2023-06-09 DIAGNOSIS — R159 Full incontinence of feces: Secondary | ICD-10-CM | POA: Diagnosis not present

## 2023-06-09 DIAGNOSIS — F0280 Dementia in other diseases classified elsewhere without behavioral disturbance: Secondary | ICD-10-CM | POA: Diagnosis not present

## 2023-06-09 DIAGNOSIS — G3101 Pick's disease: Secondary | ICD-10-CM | POA: Diagnosis not present

## 2023-06-12 DIAGNOSIS — R1310 Dysphagia, unspecified: Secondary | ICD-10-CM | POA: Diagnosis not present

## 2023-06-12 DIAGNOSIS — Z87440 Personal history of urinary (tract) infections: Secondary | ICD-10-CM | POA: Diagnosis not present

## 2023-06-12 DIAGNOSIS — G40909 Epilepsy, unspecified, not intractable, without status epilepticus: Secondary | ICD-10-CM | POA: Diagnosis not present

## 2023-06-12 DIAGNOSIS — R32 Unspecified urinary incontinence: Secondary | ICD-10-CM | POA: Diagnosis not present

## 2023-06-12 DIAGNOSIS — G3101 Pick's disease: Secondary | ICD-10-CM | POA: Diagnosis not present

## 2023-06-12 DIAGNOSIS — F0280 Dementia in other diseases classified elsewhere without behavioral disturbance: Secondary | ICD-10-CM | POA: Diagnosis not present

## 2023-06-19 DIAGNOSIS — R32 Unspecified urinary incontinence: Secondary | ICD-10-CM | POA: Diagnosis not present

## 2023-06-19 DIAGNOSIS — Z87440 Personal history of urinary (tract) infections: Secondary | ICD-10-CM | POA: Diagnosis not present

## 2023-06-19 DIAGNOSIS — F0280 Dementia in other diseases classified elsewhere without behavioral disturbance: Secondary | ICD-10-CM | POA: Diagnosis not present

## 2023-06-19 DIAGNOSIS — G3101 Pick's disease: Secondary | ICD-10-CM | POA: Diagnosis not present

## 2023-06-19 DIAGNOSIS — G40909 Epilepsy, unspecified, not intractable, without status epilepticus: Secondary | ICD-10-CM | POA: Diagnosis not present

## 2023-06-19 DIAGNOSIS — R1310 Dysphagia, unspecified: Secondary | ICD-10-CM | POA: Diagnosis not present

## 2023-06-24 DIAGNOSIS — Z87440 Personal history of urinary (tract) infections: Secondary | ICD-10-CM | POA: Diagnosis not present

## 2023-06-24 DIAGNOSIS — G3101 Pick's disease: Secondary | ICD-10-CM | POA: Diagnosis not present

## 2023-06-24 DIAGNOSIS — R32 Unspecified urinary incontinence: Secondary | ICD-10-CM | POA: Diagnosis not present

## 2023-06-24 DIAGNOSIS — G40909 Epilepsy, unspecified, not intractable, without status epilepticus: Secondary | ICD-10-CM | POA: Diagnosis not present

## 2023-06-24 DIAGNOSIS — R1310 Dysphagia, unspecified: Secondary | ICD-10-CM | POA: Diagnosis not present

## 2023-06-24 DIAGNOSIS — F0280 Dementia in other diseases classified elsewhere without behavioral disturbance: Secondary | ICD-10-CM | POA: Diagnosis not present

## 2023-06-26 DIAGNOSIS — Z87440 Personal history of urinary (tract) infections: Secondary | ICD-10-CM | POA: Diagnosis not present

## 2023-06-26 DIAGNOSIS — F0280 Dementia in other diseases classified elsewhere without behavioral disturbance: Secondary | ICD-10-CM | POA: Diagnosis not present

## 2023-06-26 DIAGNOSIS — G40909 Epilepsy, unspecified, not intractable, without status epilepticus: Secondary | ICD-10-CM | POA: Diagnosis not present

## 2023-06-26 DIAGNOSIS — R32 Unspecified urinary incontinence: Secondary | ICD-10-CM | POA: Diagnosis not present

## 2023-06-26 DIAGNOSIS — G3101 Pick's disease: Secondary | ICD-10-CM | POA: Diagnosis not present

## 2023-06-26 DIAGNOSIS — R1310 Dysphagia, unspecified: Secondary | ICD-10-CM | POA: Diagnosis not present

## 2023-07-03 DIAGNOSIS — Z87440 Personal history of urinary (tract) infections: Secondary | ICD-10-CM | POA: Diagnosis not present

## 2023-07-03 DIAGNOSIS — F0280 Dementia in other diseases classified elsewhere without behavioral disturbance: Secondary | ICD-10-CM | POA: Diagnosis not present

## 2023-07-03 DIAGNOSIS — R32 Unspecified urinary incontinence: Secondary | ICD-10-CM | POA: Diagnosis not present

## 2023-07-03 DIAGNOSIS — G3101 Pick's disease: Secondary | ICD-10-CM | POA: Diagnosis not present

## 2023-07-03 DIAGNOSIS — R1310 Dysphagia, unspecified: Secondary | ICD-10-CM | POA: Diagnosis not present

## 2023-07-03 DIAGNOSIS — G40909 Epilepsy, unspecified, not intractable, without status epilepticus: Secondary | ICD-10-CM | POA: Diagnosis not present

## 2023-07-05 DIAGNOSIS — R32 Unspecified urinary incontinence: Secondary | ICD-10-CM | POA: Diagnosis not present

## 2023-07-05 DIAGNOSIS — Z87440 Personal history of urinary (tract) infections: Secondary | ICD-10-CM | POA: Diagnosis not present

## 2023-07-05 DIAGNOSIS — G40909 Epilepsy, unspecified, not intractable, without status epilepticus: Secondary | ICD-10-CM | POA: Diagnosis not present

## 2023-07-05 DIAGNOSIS — F0280 Dementia in other diseases classified elsewhere without behavioral disturbance: Secondary | ICD-10-CM | POA: Diagnosis not present

## 2023-07-05 DIAGNOSIS — R1310 Dysphagia, unspecified: Secondary | ICD-10-CM | POA: Diagnosis not present

## 2023-07-05 DIAGNOSIS — G3101 Pick's disease: Secondary | ICD-10-CM | POA: Diagnosis not present

## 2023-07-07 DIAGNOSIS — R1310 Dysphagia, unspecified: Secondary | ICD-10-CM | POA: Diagnosis not present

## 2023-07-07 DIAGNOSIS — Z87440 Personal history of urinary (tract) infections: Secondary | ICD-10-CM | POA: Diagnosis not present

## 2023-07-07 DIAGNOSIS — G3101 Pick's disease: Secondary | ICD-10-CM | POA: Diagnosis not present

## 2023-07-07 DIAGNOSIS — F0280 Dementia in other diseases classified elsewhere without behavioral disturbance: Secondary | ICD-10-CM | POA: Diagnosis not present

## 2023-07-07 DIAGNOSIS — R159 Full incontinence of feces: Secondary | ICD-10-CM | POA: Diagnosis not present

## 2023-07-07 DIAGNOSIS — R32 Unspecified urinary incontinence: Secondary | ICD-10-CM | POA: Diagnosis not present

## 2023-07-07 DIAGNOSIS — G40909 Epilepsy, unspecified, not intractable, without status epilepticus: Secondary | ICD-10-CM | POA: Diagnosis not present

## 2023-07-07 DIAGNOSIS — Z741 Need for assistance with personal care: Secondary | ICD-10-CM | POA: Diagnosis not present

## 2023-07-10 DIAGNOSIS — G40909 Epilepsy, unspecified, not intractable, without status epilepticus: Secondary | ICD-10-CM | POA: Diagnosis not present

## 2023-07-10 DIAGNOSIS — G3101 Pick's disease: Secondary | ICD-10-CM | POA: Diagnosis not present

## 2023-07-10 DIAGNOSIS — F0280 Dementia in other diseases classified elsewhere without behavioral disturbance: Secondary | ICD-10-CM | POA: Diagnosis not present

## 2023-07-10 DIAGNOSIS — R1310 Dysphagia, unspecified: Secondary | ICD-10-CM | POA: Diagnosis not present

## 2023-07-10 DIAGNOSIS — R32 Unspecified urinary incontinence: Secondary | ICD-10-CM | POA: Diagnosis not present

## 2023-07-10 DIAGNOSIS — Z87440 Personal history of urinary (tract) infections: Secondary | ICD-10-CM | POA: Diagnosis not present

## 2023-07-17 DIAGNOSIS — G3101 Pick's disease: Secondary | ICD-10-CM | POA: Diagnosis not present

## 2023-07-17 DIAGNOSIS — Z87440 Personal history of urinary (tract) infections: Secondary | ICD-10-CM | POA: Diagnosis not present

## 2023-07-17 DIAGNOSIS — R32 Unspecified urinary incontinence: Secondary | ICD-10-CM | POA: Diagnosis not present

## 2023-07-17 DIAGNOSIS — R1310 Dysphagia, unspecified: Secondary | ICD-10-CM | POA: Diagnosis not present

## 2023-07-17 DIAGNOSIS — G40909 Epilepsy, unspecified, not intractable, without status epilepticus: Secondary | ICD-10-CM | POA: Diagnosis not present

## 2023-07-17 DIAGNOSIS — F0280 Dementia in other diseases classified elsewhere without behavioral disturbance: Secondary | ICD-10-CM | POA: Diagnosis not present

## 2023-07-24 DIAGNOSIS — Z87440 Personal history of urinary (tract) infections: Secondary | ICD-10-CM | POA: Diagnosis not present

## 2023-07-24 DIAGNOSIS — R1310 Dysphagia, unspecified: Secondary | ICD-10-CM | POA: Diagnosis not present

## 2023-07-24 DIAGNOSIS — G3101 Pick's disease: Secondary | ICD-10-CM | POA: Diagnosis not present

## 2023-07-24 DIAGNOSIS — G40909 Epilepsy, unspecified, not intractable, without status epilepticus: Secondary | ICD-10-CM | POA: Diagnosis not present

## 2023-07-24 DIAGNOSIS — R32 Unspecified urinary incontinence: Secondary | ICD-10-CM | POA: Diagnosis not present

## 2023-07-24 DIAGNOSIS — F0280 Dementia in other diseases classified elsewhere without behavioral disturbance: Secondary | ICD-10-CM | POA: Diagnosis not present

## 2023-07-25 DIAGNOSIS — G3101 Pick's disease: Secondary | ICD-10-CM | POA: Diagnosis not present

## 2023-07-25 DIAGNOSIS — Z87440 Personal history of urinary (tract) infections: Secondary | ICD-10-CM | POA: Diagnosis not present

## 2023-07-25 DIAGNOSIS — R32 Unspecified urinary incontinence: Secondary | ICD-10-CM | POA: Diagnosis not present

## 2023-07-25 DIAGNOSIS — R1310 Dysphagia, unspecified: Secondary | ICD-10-CM | POA: Diagnosis not present

## 2023-07-25 DIAGNOSIS — G40909 Epilepsy, unspecified, not intractable, without status epilepticus: Secondary | ICD-10-CM | POA: Diagnosis not present

## 2023-07-25 DIAGNOSIS — F0280 Dementia in other diseases classified elsewhere without behavioral disturbance: Secondary | ICD-10-CM | POA: Diagnosis not present

## 2023-07-31 DIAGNOSIS — F0280 Dementia in other diseases classified elsewhere without behavioral disturbance: Secondary | ICD-10-CM | POA: Diagnosis not present

## 2023-07-31 DIAGNOSIS — Z87440 Personal history of urinary (tract) infections: Secondary | ICD-10-CM | POA: Diagnosis not present

## 2023-07-31 DIAGNOSIS — R32 Unspecified urinary incontinence: Secondary | ICD-10-CM | POA: Diagnosis not present

## 2023-07-31 DIAGNOSIS — R1310 Dysphagia, unspecified: Secondary | ICD-10-CM | POA: Diagnosis not present

## 2023-07-31 DIAGNOSIS — G3101 Pick's disease: Secondary | ICD-10-CM | POA: Diagnosis not present

## 2023-07-31 DIAGNOSIS — G40909 Epilepsy, unspecified, not intractable, without status epilepticus: Secondary | ICD-10-CM | POA: Diagnosis not present

## 2023-08-01 DIAGNOSIS — Z87440 Personal history of urinary (tract) infections: Secondary | ICD-10-CM | POA: Diagnosis not present

## 2023-08-01 DIAGNOSIS — F0280 Dementia in other diseases classified elsewhere without behavioral disturbance: Secondary | ICD-10-CM | POA: Diagnosis not present

## 2023-08-01 DIAGNOSIS — R32 Unspecified urinary incontinence: Secondary | ICD-10-CM | POA: Diagnosis not present

## 2023-08-01 DIAGNOSIS — G40909 Epilepsy, unspecified, not intractable, without status epilepticus: Secondary | ICD-10-CM | POA: Diagnosis not present

## 2023-08-01 DIAGNOSIS — G3101 Pick's disease: Secondary | ICD-10-CM | POA: Diagnosis not present

## 2023-08-01 DIAGNOSIS — R1310 Dysphagia, unspecified: Secondary | ICD-10-CM | POA: Diagnosis not present

## 2023-08-07 DIAGNOSIS — G40909 Epilepsy, unspecified, not intractable, without status epilepticus: Secondary | ICD-10-CM | POA: Diagnosis not present

## 2023-08-07 DIAGNOSIS — G3101 Pick's disease: Secondary | ICD-10-CM | POA: Diagnosis not present

## 2023-08-07 DIAGNOSIS — Z741 Need for assistance with personal care: Secondary | ICD-10-CM | POA: Diagnosis not present

## 2023-08-07 DIAGNOSIS — R1310 Dysphagia, unspecified: Secondary | ICD-10-CM | POA: Diagnosis not present

## 2023-08-07 DIAGNOSIS — R159 Full incontinence of feces: Secondary | ICD-10-CM | POA: Diagnosis not present

## 2023-08-07 DIAGNOSIS — F0280 Dementia in other diseases classified elsewhere without behavioral disturbance: Secondary | ICD-10-CM | POA: Diagnosis not present

## 2023-08-07 DIAGNOSIS — R32 Unspecified urinary incontinence: Secondary | ICD-10-CM | POA: Diagnosis not present

## 2023-08-07 DIAGNOSIS — Z87440 Personal history of urinary (tract) infections: Secondary | ICD-10-CM | POA: Diagnosis not present

## 2023-08-14 DIAGNOSIS — G3101 Pick's disease: Secondary | ICD-10-CM | POA: Diagnosis not present

## 2023-08-14 DIAGNOSIS — F0280 Dementia in other diseases classified elsewhere without behavioral disturbance: Secondary | ICD-10-CM | POA: Diagnosis not present

## 2023-08-14 DIAGNOSIS — G40909 Epilepsy, unspecified, not intractable, without status epilepticus: Secondary | ICD-10-CM | POA: Diagnosis not present

## 2023-08-14 DIAGNOSIS — R1310 Dysphagia, unspecified: Secondary | ICD-10-CM | POA: Diagnosis not present

## 2023-08-14 DIAGNOSIS — Z87440 Personal history of urinary (tract) infections: Secondary | ICD-10-CM | POA: Diagnosis not present

## 2023-08-14 DIAGNOSIS — R32 Unspecified urinary incontinence: Secondary | ICD-10-CM | POA: Diagnosis not present

## 2023-08-21 DIAGNOSIS — G3101 Pick's disease: Secondary | ICD-10-CM | POA: Diagnosis not present

## 2023-08-21 DIAGNOSIS — R32 Unspecified urinary incontinence: Secondary | ICD-10-CM | POA: Diagnosis not present

## 2023-08-21 DIAGNOSIS — F0280 Dementia in other diseases classified elsewhere without behavioral disturbance: Secondary | ICD-10-CM | POA: Diagnosis not present

## 2023-08-21 DIAGNOSIS — G40909 Epilepsy, unspecified, not intractable, without status epilepticus: Secondary | ICD-10-CM | POA: Diagnosis not present

## 2023-08-21 DIAGNOSIS — R1310 Dysphagia, unspecified: Secondary | ICD-10-CM | POA: Diagnosis not present

## 2023-08-21 DIAGNOSIS — Z87440 Personal history of urinary (tract) infections: Secondary | ICD-10-CM | POA: Diagnosis not present

## 2023-08-23 DIAGNOSIS — F0280 Dementia in other diseases classified elsewhere without behavioral disturbance: Secondary | ICD-10-CM | POA: Diagnosis not present

## 2023-08-23 DIAGNOSIS — Z87440 Personal history of urinary (tract) infections: Secondary | ICD-10-CM | POA: Diagnosis not present

## 2023-08-23 DIAGNOSIS — G3101 Pick's disease: Secondary | ICD-10-CM | POA: Diagnosis not present

## 2023-08-23 DIAGNOSIS — R32 Unspecified urinary incontinence: Secondary | ICD-10-CM | POA: Diagnosis not present

## 2023-08-23 DIAGNOSIS — R1310 Dysphagia, unspecified: Secondary | ICD-10-CM | POA: Diagnosis not present

## 2023-08-23 DIAGNOSIS — G40909 Epilepsy, unspecified, not intractable, without status epilepticus: Secondary | ICD-10-CM | POA: Diagnosis not present

## 2023-08-28 DIAGNOSIS — R32 Unspecified urinary incontinence: Secondary | ICD-10-CM | POA: Diagnosis not present

## 2023-08-28 DIAGNOSIS — G40909 Epilepsy, unspecified, not intractable, without status epilepticus: Secondary | ICD-10-CM | POA: Diagnosis not present

## 2023-08-28 DIAGNOSIS — F0280 Dementia in other diseases classified elsewhere without behavioral disturbance: Secondary | ICD-10-CM | POA: Diagnosis not present

## 2023-08-28 DIAGNOSIS — Z87440 Personal history of urinary (tract) infections: Secondary | ICD-10-CM | POA: Diagnosis not present

## 2023-08-28 DIAGNOSIS — G3101 Pick's disease: Secondary | ICD-10-CM | POA: Diagnosis not present

## 2023-08-28 DIAGNOSIS — R1310 Dysphagia, unspecified: Secondary | ICD-10-CM | POA: Diagnosis not present

## 2023-09-03 ENCOUNTER — Ambulatory Visit: Payer: Medicare Other | Admitting: Neurology

## 2023-09-04 DIAGNOSIS — G3101 Pick's disease: Secondary | ICD-10-CM | POA: Diagnosis not present

## 2023-09-04 DIAGNOSIS — Z87440 Personal history of urinary (tract) infections: Secondary | ICD-10-CM | POA: Diagnosis not present

## 2023-09-04 DIAGNOSIS — R32 Unspecified urinary incontinence: Secondary | ICD-10-CM | POA: Diagnosis not present

## 2023-09-04 DIAGNOSIS — F0280 Dementia in other diseases classified elsewhere without behavioral disturbance: Secondary | ICD-10-CM | POA: Diagnosis not present

## 2023-09-04 DIAGNOSIS — G40909 Epilepsy, unspecified, not intractable, without status epilepticus: Secondary | ICD-10-CM | POA: Diagnosis not present

## 2023-09-04 DIAGNOSIS — R1310 Dysphagia, unspecified: Secondary | ICD-10-CM | POA: Diagnosis not present

## 2023-09-06 DIAGNOSIS — G3101 Pick's disease: Secondary | ICD-10-CM | POA: Diagnosis not present

## 2023-09-06 DIAGNOSIS — G40909 Epilepsy, unspecified, not intractable, without status epilepticus: Secondary | ICD-10-CM | POA: Diagnosis not present

## 2023-09-06 DIAGNOSIS — R1310 Dysphagia, unspecified: Secondary | ICD-10-CM | POA: Diagnosis not present

## 2023-09-06 DIAGNOSIS — R32 Unspecified urinary incontinence: Secondary | ICD-10-CM | POA: Diagnosis not present

## 2023-09-06 DIAGNOSIS — Z741 Need for assistance with personal care: Secondary | ICD-10-CM | POA: Diagnosis not present

## 2023-09-06 DIAGNOSIS — Z87440 Personal history of urinary (tract) infections: Secondary | ICD-10-CM | POA: Diagnosis not present

## 2023-09-06 DIAGNOSIS — F0280 Dementia in other diseases classified elsewhere without behavioral disturbance: Secondary | ICD-10-CM | POA: Diagnosis not present

## 2023-09-06 DIAGNOSIS — R159 Full incontinence of feces: Secondary | ICD-10-CM | POA: Diagnosis not present

## 2023-09-11 DIAGNOSIS — R32 Unspecified urinary incontinence: Secondary | ICD-10-CM | POA: Diagnosis not present

## 2023-09-11 DIAGNOSIS — Z87440 Personal history of urinary (tract) infections: Secondary | ICD-10-CM | POA: Diagnosis not present

## 2023-09-11 DIAGNOSIS — G3101 Pick's disease: Secondary | ICD-10-CM | POA: Diagnosis not present

## 2023-09-11 DIAGNOSIS — R1310 Dysphagia, unspecified: Secondary | ICD-10-CM | POA: Diagnosis not present

## 2023-09-11 DIAGNOSIS — G40909 Epilepsy, unspecified, not intractable, without status epilepticus: Secondary | ICD-10-CM | POA: Diagnosis not present

## 2023-09-11 DIAGNOSIS — F0280 Dementia in other diseases classified elsewhere without behavioral disturbance: Secondary | ICD-10-CM | POA: Diagnosis not present

## 2023-09-12 ENCOUNTER — Ambulatory Visit: Payer: Medicare Other | Admitting: Neurology

## 2023-09-14 ENCOUNTER — Encounter: Payer: Self-pay | Admitting: Neurology

## 2023-09-14 ENCOUNTER — Telehealth: Payer: Self-pay | Admitting: Neurology

## 2023-09-14 NOTE — Telephone Encounter (Signed)
 Patient's husband came in and dropped by notes for Dr. Festus Hubert to have before their virtual appointment on 09/18/23. They are in Dr. Sherilyn Dings box

## 2023-09-14 NOTE — Telephone Encounter (Signed)
 Received. Patient will need a In office visit.

## 2023-09-18 ENCOUNTER — Telehealth: Payer: Medicare Other | Admitting: Neurology

## 2023-09-18 ENCOUNTER — Encounter: Payer: Self-pay | Admitting: Neurology

## 2023-09-18 DIAGNOSIS — Z87440 Personal history of urinary (tract) infections: Secondary | ICD-10-CM | POA: Diagnosis not present

## 2023-09-18 DIAGNOSIS — R32 Unspecified urinary incontinence: Secondary | ICD-10-CM | POA: Diagnosis not present

## 2023-09-18 DIAGNOSIS — G40909 Epilepsy, unspecified, not intractable, without status epilepticus: Secondary | ICD-10-CM | POA: Diagnosis not present

## 2023-09-18 DIAGNOSIS — F0280 Dementia in other diseases classified elsewhere without behavioral disturbance: Secondary | ICD-10-CM | POA: Diagnosis not present

## 2023-09-18 DIAGNOSIS — R1310 Dysphagia, unspecified: Secondary | ICD-10-CM | POA: Diagnosis not present

## 2023-09-18 DIAGNOSIS — G3101 Pick's disease: Secondary | ICD-10-CM | POA: Diagnosis not present

## 2023-09-19 NOTE — Telephone Encounter (Signed)
 Michele Porter called in wanting to speak with Michele Porter. He has some questions about the message he received this morning. He is requesting a call before 3:30 or after 4:00.

## 2023-09-19 NOTE — Telephone Encounter (Signed)
 Tried calling husband no answer. LMOVM.  Patient has to come into the office to be seen.

## 2023-09-23 ENCOUNTER — Encounter: Payer: Self-pay | Admitting: Neurology

## 2023-09-25 DIAGNOSIS — F0280 Dementia in other diseases classified elsewhere without behavioral disturbance: Secondary | ICD-10-CM | POA: Diagnosis not present

## 2023-09-25 DIAGNOSIS — Z87440 Personal history of urinary (tract) infections: Secondary | ICD-10-CM | POA: Diagnosis not present

## 2023-09-25 DIAGNOSIS — G40909 Epilepsy, unspecified, not intractable, without status epilepticus: Secondary | ICD-10-CM | POA: Diagnosis not present

## 2023-09-25 DIAGNOSIS — R1310 Dysphagia, unspecified: Secondary | ICD-10-CM | POA: Diagnosis not present

## 2023-09-25 DIAGNOSIS — G3101 Pick's disease: Secondary | ICD-10-CM | POA: Diagnosis not present

## 2023-09-25 DIAGNOSIS — R32 Unspecified urinary incontinence: Secondary | ICD-10-CM | POA: Diagnosis not present

## 2023-09-27 DIAGNOSIS — R32 Unspecified urinary incontinence: Secondary | ICD-10-CM | POA: Diagnosis not present

## 2023-09-27 DIAGNOSIS — G40909 Epilepsy, unspecified, not intractable, without status epilepticus: Secondary | ICD-10-CM | POA: Diagnosis not present

## 2023-09-27 DIAGNOSIS — Z87440 Personal history of urinary (tract) infections: Secondary | ICD-10-CM | POA: Diagnosis not present

## 2023-09-27 DIAGNOSIS — G3101 Pick's disease: Secondary | ICD-10-CM | POA: Diagnosis not present

## 2023-09-27 DIAGNOSIS — F0280 Dementia in other diseases classified elsewhere without behavioral disturbance: Secondary | ICD-10-CM | POA: Diagnosis not present

## 2023-09-27 DIAGNOSIS — R1310 Dysphagia, unspecified: Secondary | ICD-10-CM | POA: Diagnosis not present

## 2023-10-02 DIAGNOSIS — G3101 Pick's disease: Secondary | ICD-10-CM | POA: Diagnosis not present

## 2023-10-02 DIAGNOSIS — Z87440 Personal history of urinary (tract) infections: Secondary | ICD-10-CM | POA: Diagnosis not present

## 2023-10-02 DIAGNOSIS — G40909 Epilepsy, unspecified, not intractable, without status epilepticus: Secondary | ICD-10-CM | POA: Diagnosis not present

## 2023-10-02 DIAGNOSIS — R32 Unspecified urinary incontinence: Secondary | ICD-10-CM | POA: Diagnosis not present

## 2023-10-02 DIAGNOSIS — F0280 Dementia in other diseases classified elsewhere without behavioral disturbance: Secondary | ICD-10-CM | POA: Diagnosis not present

## 2023-10-02 DIAGNOSIS — R1310 Dysphagia, unspecified: Secondary | ICD-10-CM | POA: Diagnosis not present

## 2023-10-07 DIAGNOSIS — G3101 Pick's disease: Secondary | ICD-10-CM | POA: Diagnosis not present

## 2023-10-07 DIAGNOSIS — Z87440 Personal history of urinary (tract) infections: Secondary | ICD-10-CM | POA: Diagnosis not present

## 2023-10-07 DIAGNOSIS — F0280 Dementia in other diseases classified elsewhere without behavioral disturbance: Secondary | ICD-10-CM | POA: Diagnosis not present

## 2023-10-07 DIAGNOSIS — R32 Unspecified urinary incontinence: Secondary | ICD-10-CM | POA: Diagnosis not present

## 2023-10-07 DIAGNOSIS — G40909 Epilepsy, unspecified, not intractable, without status epilepticus: Secondary | ICD-10-CM | POA: Diagnosis not present

## 2023-10-07 DIAGNOSIS — R159 Full incontinence of feces: Secondary | ICD-10-CM | POA: Diagnosis not present

## 2023-10-07 DIAGNOSIS — Z741 Need for assistance with personal care: Secondary | ICD-10-CM | POA: Diagnosis not present

## 2023-10-07 DIAGNOSIS — R1310 Dysphagia, unspecified: Secondary | ICD-10-CM | POA: Diagnosis not present

## 2023-10-08 ENCOUNTER — Encounter: Payer: Self-pay | Admitting: Neurology

## 2023-10-09 DIAGNOSIS — Z87440 Personal history of urinary (tract) infections: Secondary | ICD-10-CM | POA: Diagnosis not present

## 2023-10-09 DIAGNOSIS — F0280 Dementia in other diseases classified elsewhere without behavioral disturbance: Secondary | ICD-10-CM | POA: Diagnosis not present

## 2023-10-09 DIAGNOSIS — R32 Unspecified urinary incontinence: Secondary | ICD-10-CM | POA: Diagnosis not present

## 2023-10-09 DIAGNOSIS — G40909 Epilepsy, unspecified, not intractable, without status epilepticus: Secondary | ICD-10-CM | POA: Diagnosis not present

## 2023-10-09 DIAGNOSIS — R1310 Dysphagia, unspecified: Secondary | ICD-10-CM | POA: Diagnosis not present

## 2023-10-09 DIAGNOSIS — G3101 Pick's disease: Secondary | ICD-10-CM | POA: Diagnosis not present

## 2023-10-16 DIAGNOSIS — F0280 Dementia in other diseases classified elsewhere without behavioral disturbance: Secondary | ICD-10-CM | POA: Diagnosis not present

## 2023-10-16 DIAGNOSIS — R32 Unspecified urinary incontinence: Secondary | ICD-10-CM | POA: Diagnosis not present

## 2023-10-16 DIAGNOSIS — Z87440 Personal history of urinary (tract) infections: Secondary | ICD-10-CM | POA: Diagnosis not present

## 2023-10-16 DIAGNOSIS — G3101 Pick's disease: Secondary | ICD-10-CM | POA: Diagnosis not present

## 2023-10-16 DIAGNOSIS — G40909 Epilepsy, unspecified, not intractable, without status epilepticus: Secondary | ICD-10-CM | POA: Diagnosis not present

## 2023-10-16 DIAGNOSIS — R1310 Dysphagia, unspecified: Secondary | ICD-10-CM | POA: Diagnosis not present

## 2023-10-23 DIAGNOSIS — R32 Unspecified urinary incontinence: Secondary | ICD-10-CM | POA: Diagnosis not present

## 2023-10-23 DIAGNOSIS — G40909 Epilepsy, unspecified, not intractable, without status epilepticus: Secondary | ICD-10-CM | POA: Diagnosis not present

## 2023-10-23 DIAGNOSIS — Z87440 Personal history of urinary (tract) infections: Secondary | ICD-10-CM | POA: Diagnosis not present

## 2023-10-23 DIAGNOSIS — F0280 Dementia in other diseases classified elsewhere without behavioral disturbance: Secondary | ICD-10-CM | POA: Diagnosis not present

## 2023-10-23 DIAGNOSIS — R1310 Dysphagia, unspecified: Secondary | ICD-10-CM | POA: Diagnosis not present

## 2023-10-23 DIAGNOSIS — G3101 Pick's disease: Secondary | ICD-10-CM | POA: Diagnosis not present

## 2023-10-25 DIAGNOSIS — G40909 Epilepsy, unspecified, not intractable, without status epilepticus: Secondary | ICD-10-CM | POA: Diagnosis not present

## 2023-10-25 DIAGNOSIS — Z87440 Personal history of urinary (tract) infections: Secondary | ICD-10-CM | POA: Diagnosis not present

## 2023-10-25 DIAGNOSIS — R32 Unspecified urinary incontinence: Secondary | ICD-10-CM | POA: Diagnosis not present

## 2023-10-25 DIAGNOSIS — G3101 Pick's disease: Secondary | ICD-10-CM | POA: Diagnosis not present

## 2023-10-25 DIAGNOSIS — F0280 Dementia in other diseases classified elsewhere without behavioral disturbance: Secondary | ICD-10-CM | POA: Diagnosis not present

## 2023-10-25 DIAGNOSIS — R1310 Dysphagia, unspecified: Secondary | ICD-10-CM | POA: Diagnosis not present

## 2023-10-30 DIAGNOSIS — F0280 Dementia in other diseases classified elsewhere without behavioral disturbance: Secondary | ICD-10-CM | POA: Diagnosis not present

## 2023-10-30 DIAGNOSIS — R1310 Dysphagia, unspecified: Secondary | ICD-10-CM | POA: Diagnosis not present

## 2023-10-30 DIAGNOSIS — G3101 Pick's disease: Secondary | ICD-10-CM | POA: Diagnosis not present

## 2023-10-30 DIAGNOSIS — Z87440 Personal history of urinary (tract) infections: Secondary | ICD-10-CM | POA: Diagnosis not present

## 2023-10-30 DIAGNOSIS — R32 Unspecified urinary incontinence: Secondary | ICD-10-CM | POA: Diagnosis not present

## 2023-10-30 DIAGNOSIS — G40909 Epilepsy, unspecified, not intractable, without status epilepticus: Secondary | ICD-10-CM | POA: Diagnosis not present

## 2023-11-01 IMAGING — CT CT HEAD W/O CM
4 series · 16 of 47 positions shown, 18 images · non-contrast
Comparison: CT head and CT cervical spine 04/19/2020.

CLINICAL DATA: Head trauma, minor (Age >= 65y); Neck trauma (Age >=
65y)



[Series 3: head wo · axial · 0.45mm/px · z∈[-50,+70]mm · 7 of 32 slices shown, 9 images]
[im 4/32  brain]
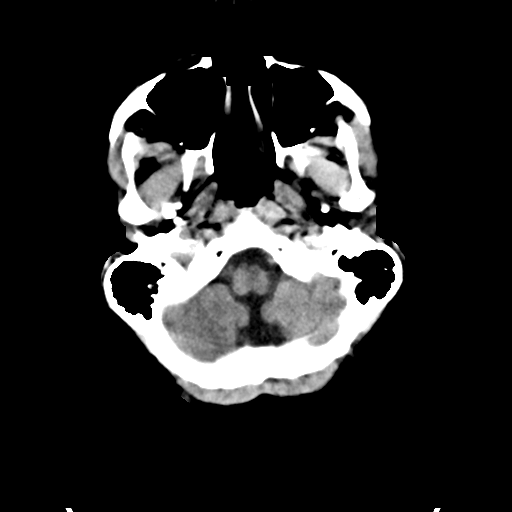
[im 4/32  bone]
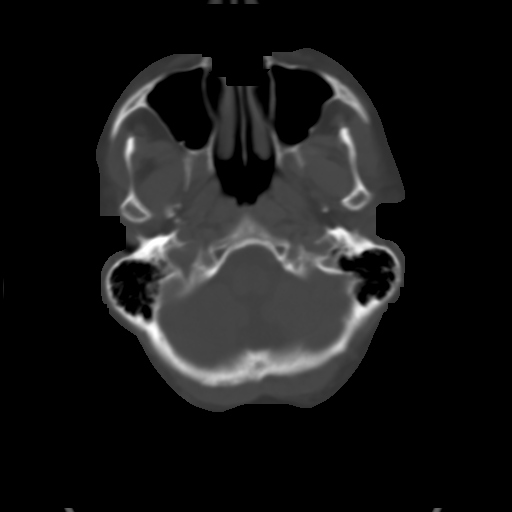
[im 8/32  brain]
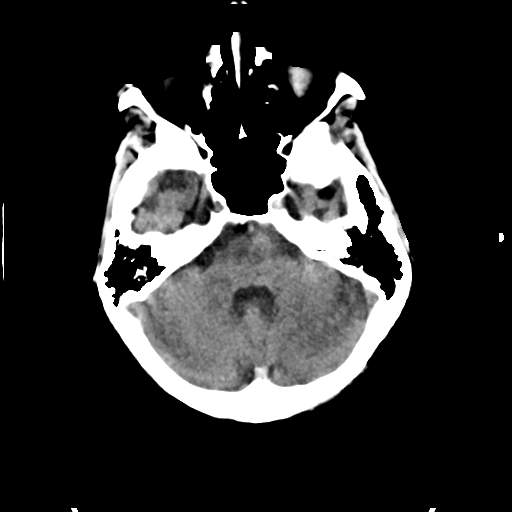
[im 12/32  brain]
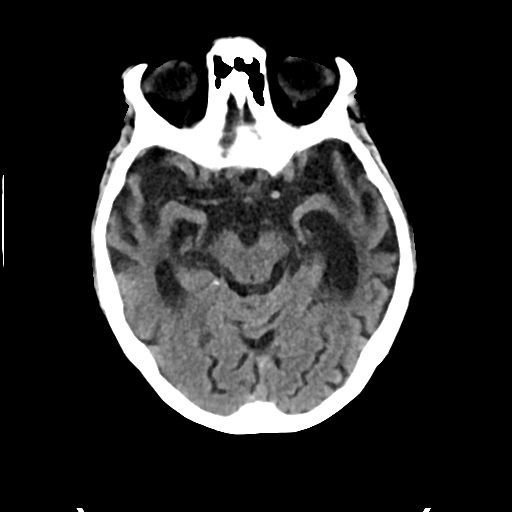
[im 16/32  brain]
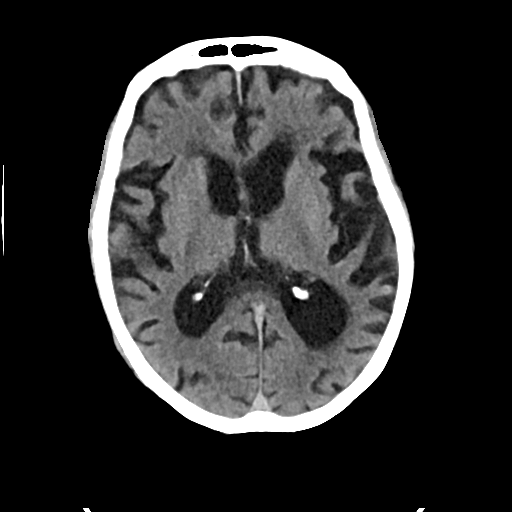
[im 20/32  brain]
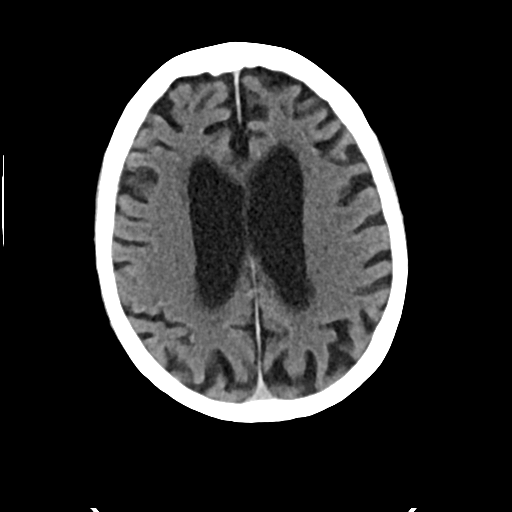
[im 20/32  bone]
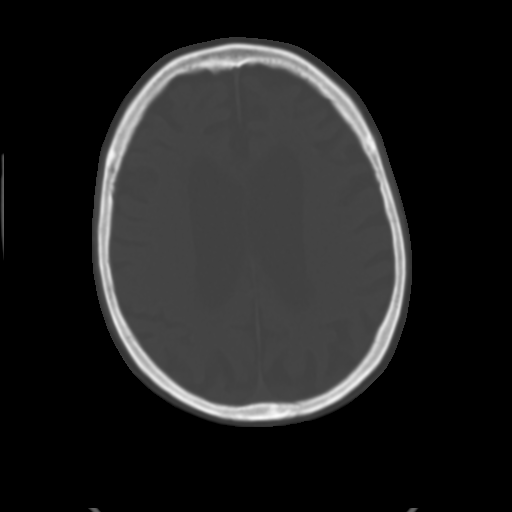
[im 24/32  brain]
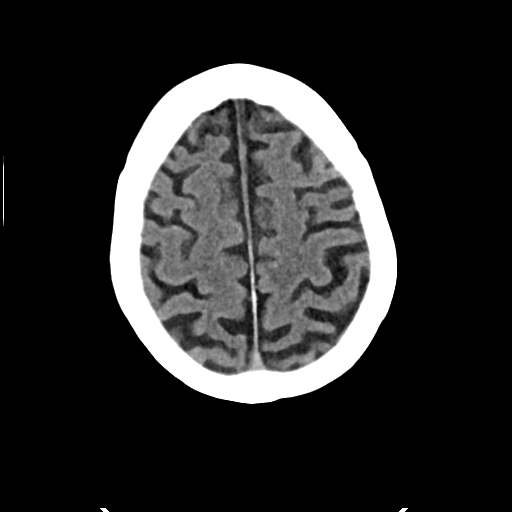
[im 28/32  brain]
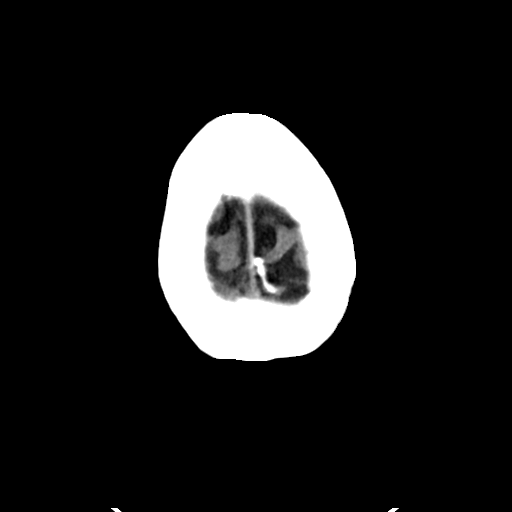

[Series 4: head bone · axial · 0.45mm/px · z∈[-50,-18]mm · 3 of 79 slices shown]
[im 8/79  bone]
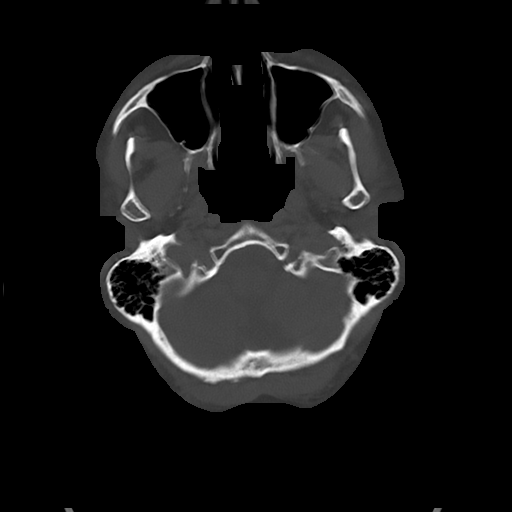
[im 16/79  bone]
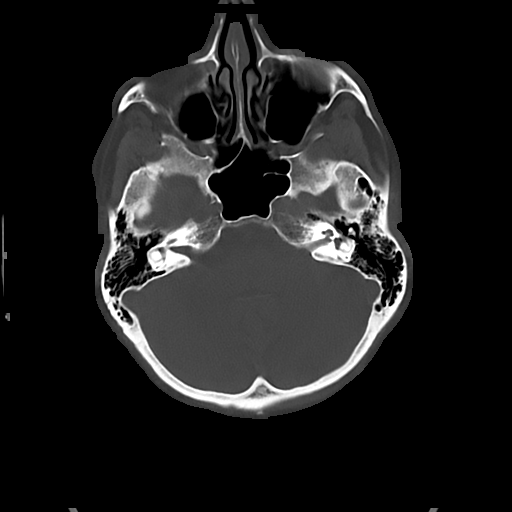
[im 24/79  bone]
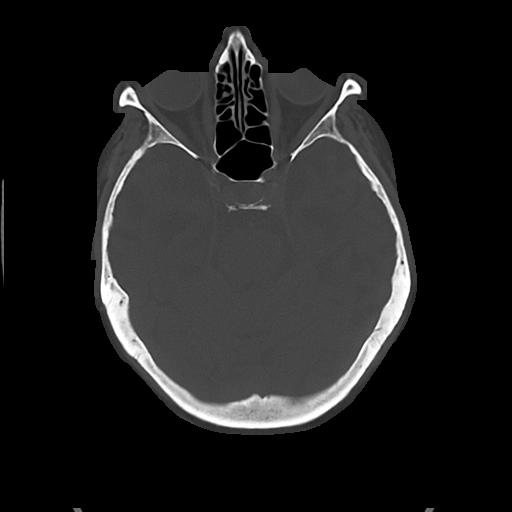

[Series 5: cor soft · coronal · 0.33mm/px · 3 of 68 slices shown]
[im 23/68  brain]
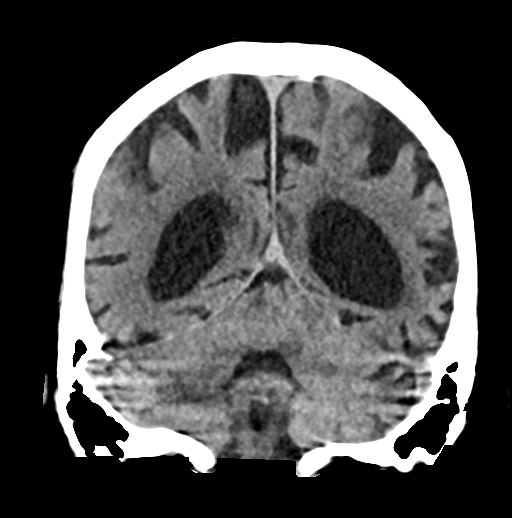
[im 30/68  brain]
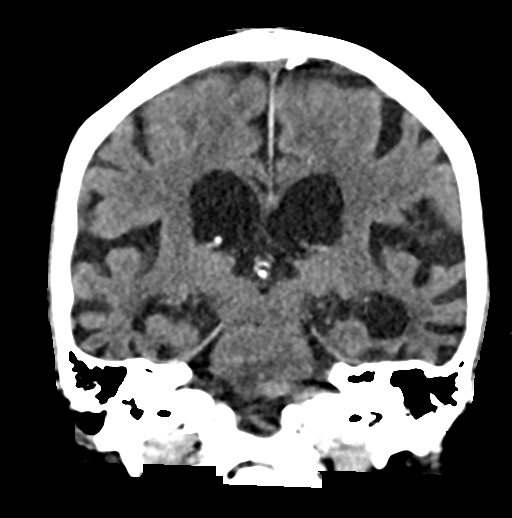
[im 38/68  brain]
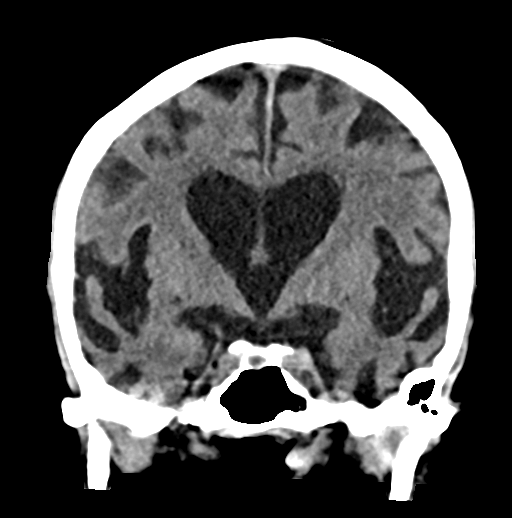

[Series 6: sag soft · sagittal · 0.34mm/px · 3 of 58 slices shown]
[im 20/58  brain]
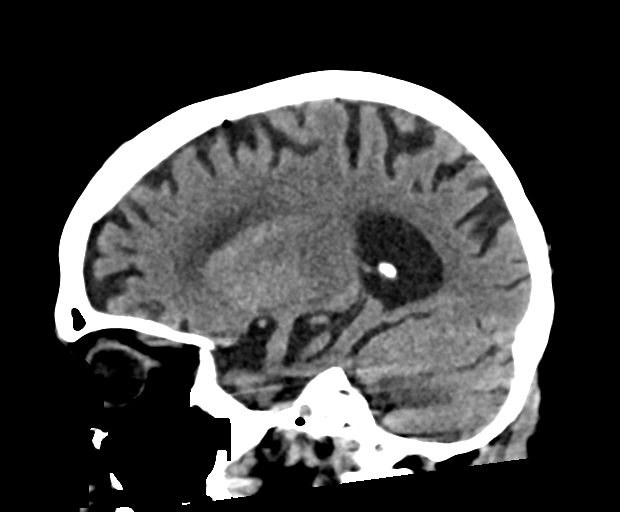
[im 29/58  brain]
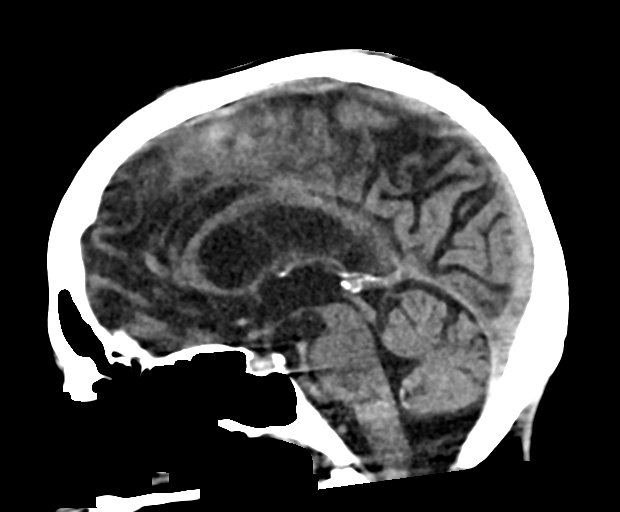
[im 39/58  brain]
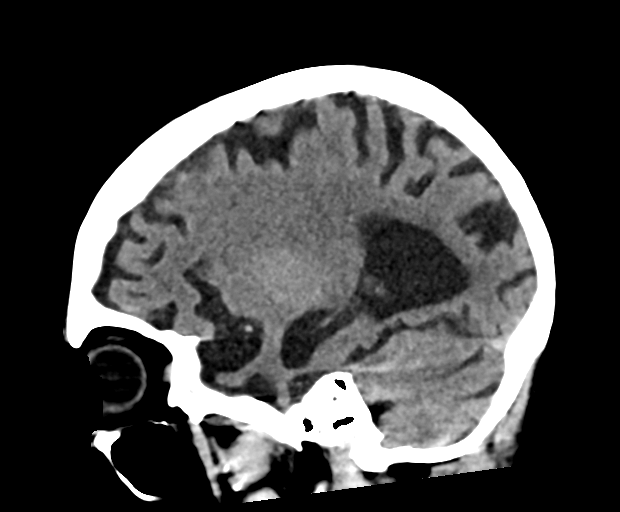

[16 of 47 positions shown; findings below may reference images not displayed]

FINDINGS: CT HEAD FINDINGS

Brain: No evidence of acute infarction, hemorrhage, hydrocephalus,
extra-axial collection or mass lesion/mass effect. Cerebral atrophy.

Vascular: Calcific intracranial atherosclerosis. No hyperdense
vessel identified.

Skull: No acute fracture.

Sinuses/Orbits: Clear visualized sinuses. No acute orbital findings.

Other: No mastoid effusions.

CT CERVICAL SPINE FINDINGS

Motion limited.

Alignment: No substantial sagittal subluxation.

Skull base and vertebrae: Vertebral body heights are maintained.

Soft tissues and spinal canal: No prevertebral fluid or swelling. No
visible canal hematoma.

Disc levels: Multilevel facet uncovertebral hypertrophy with varying
degrees of neuroforaminal stenosis. Mild-to-moderate multilevel
degenerative disc disease, most pronounced at C5-C6 posteriorly.

Upper chest: Visualized lung apices are clear.
IMPRESSION: 1. No evidence of acute intracranial abnormality.
2. No evidence of acute fracture or traumatic malalignment in the
cervical spine on motion limited study.

## 2023-11-01 IMAGING — CT CT CERVICAL SPINE W/O CM
4 series · 15 of 33 positions shown, 18 images · non-contrast
Comparison: CT head and CT cervical spine 04/19/2020.

CLINICAL DATA: Head trauma, minor (Age >= 65y); Neck trauma (Age >=
65y)



[Series 5: c spine soft (person_name) · axial · 0.45mm/px · z∈[-198,-168]mm · 2 of 93 slices shown]
[im 16/93  soft-tissue]
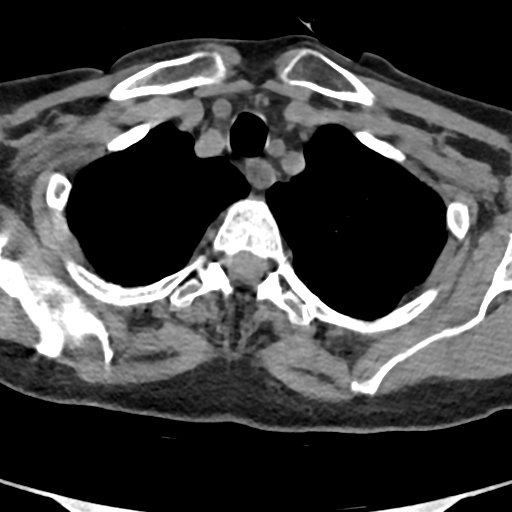
[im 31/93  soft-tissue]
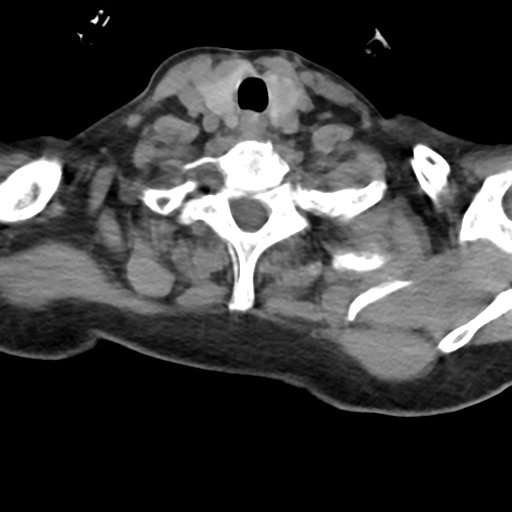

[Series 8: sag bone · sagittal · 0.36mm/px · 5 of 98 slices shown, 6 images]
[im 33/98  bone]
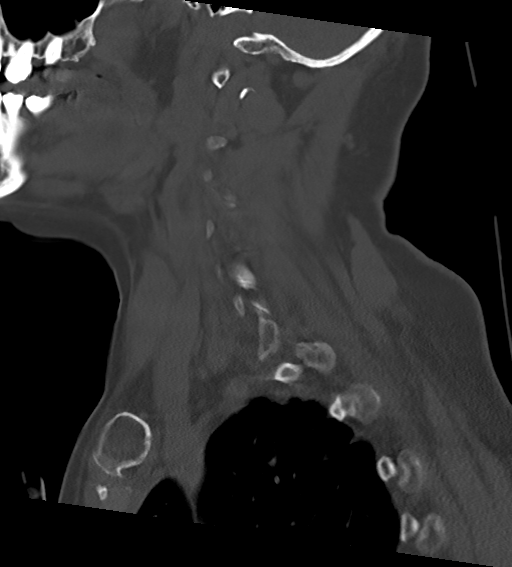
[im 41/98  bone]
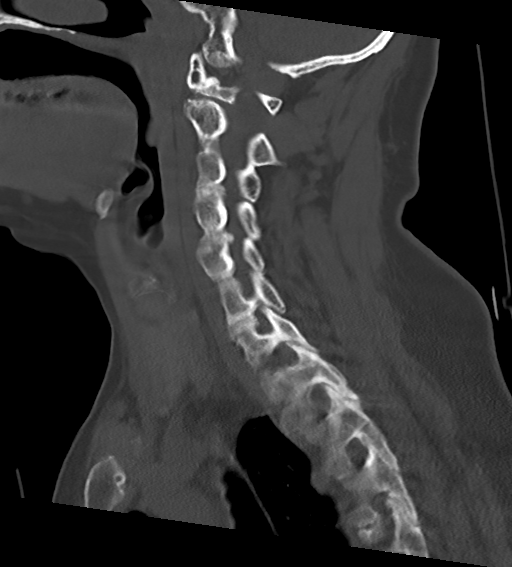
[im 49/98  soft-tissue]
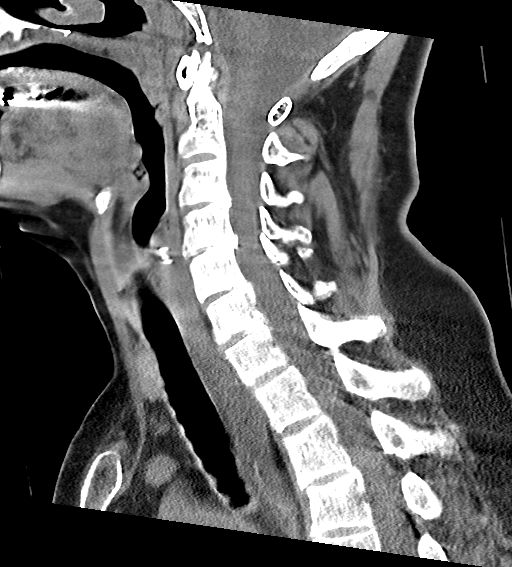
[im 49/98  bone]
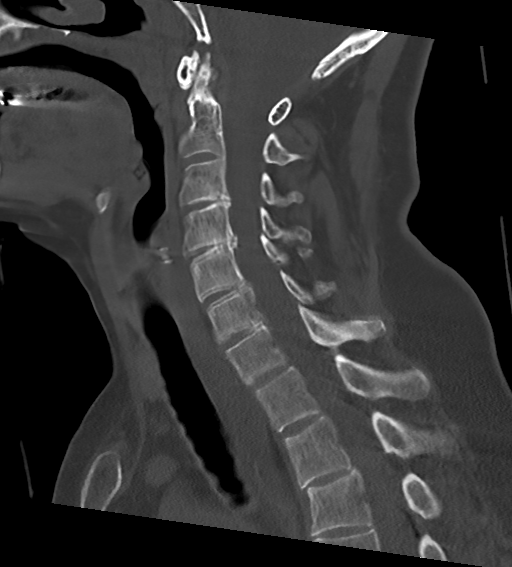
[im 57/98  bone]
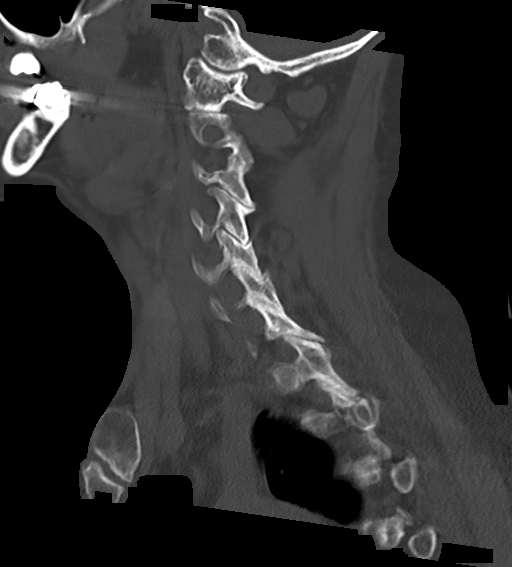
[im 65/98  bone]
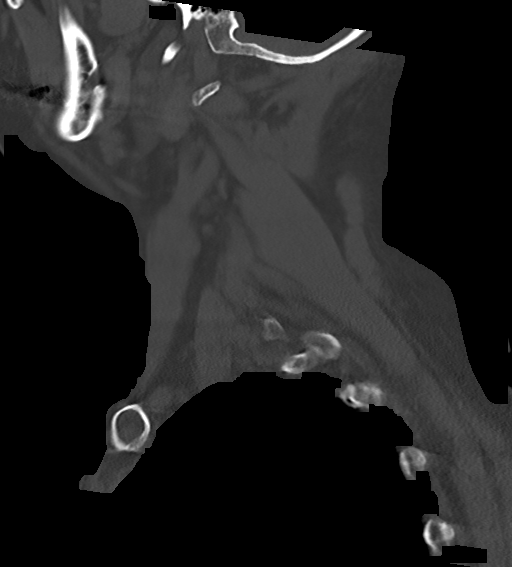

[Series 9: cor bone (person_name) · coronal · 0.38mm/px · 3 of 104 slices shown]
[im 21/104  bone]
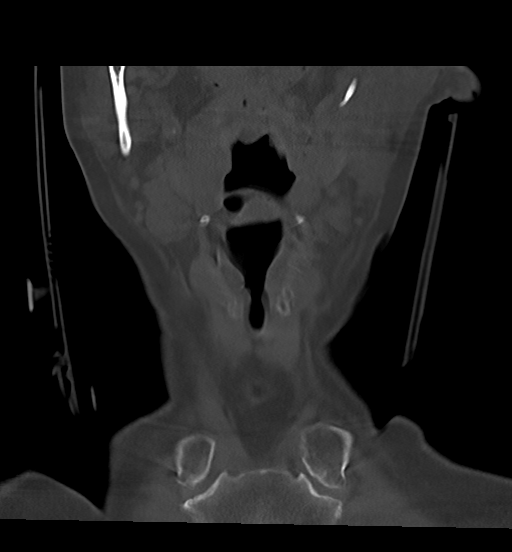
[im 42/104  bone]
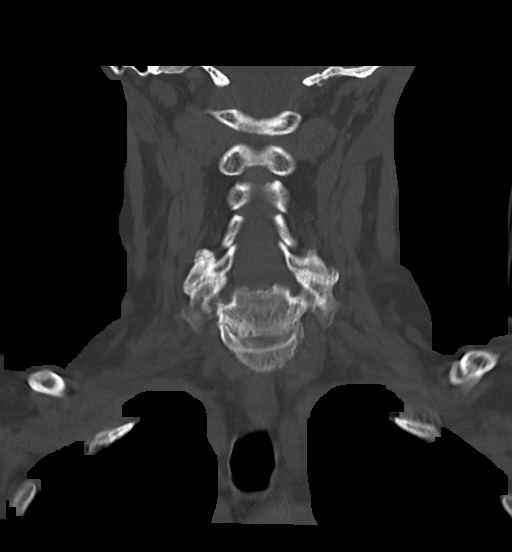
[im 62/104  bone]
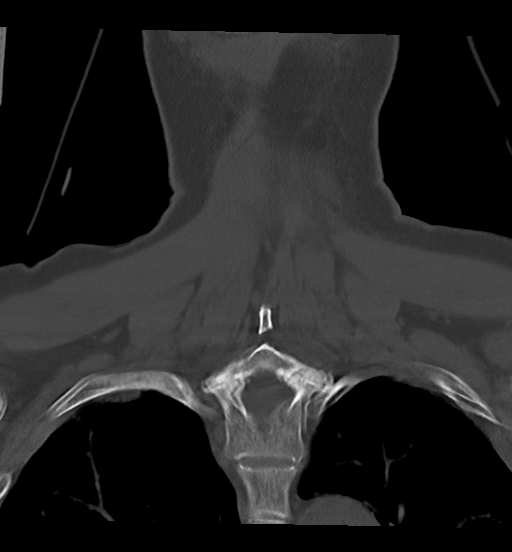

[Series 10: orthogonal axials · axial · 0.21mm/px · z∈[-206,-84]mm · 5 of 103 slices shown, 7 images]
[im 18/103  soft-tissue]
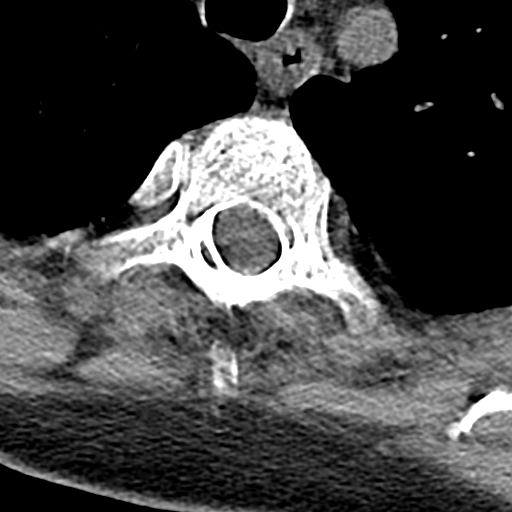
[im 18/103  bone]
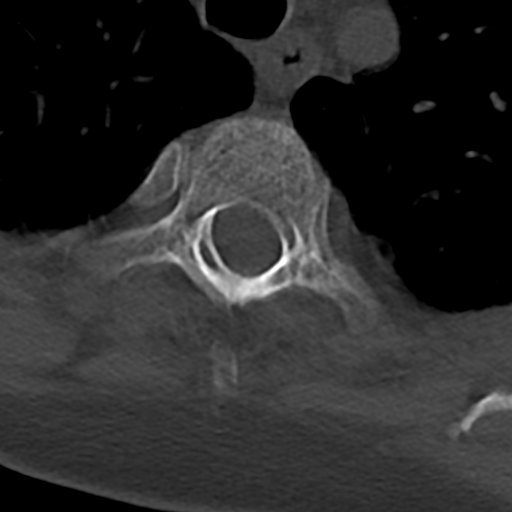
[im 35/103  bone]
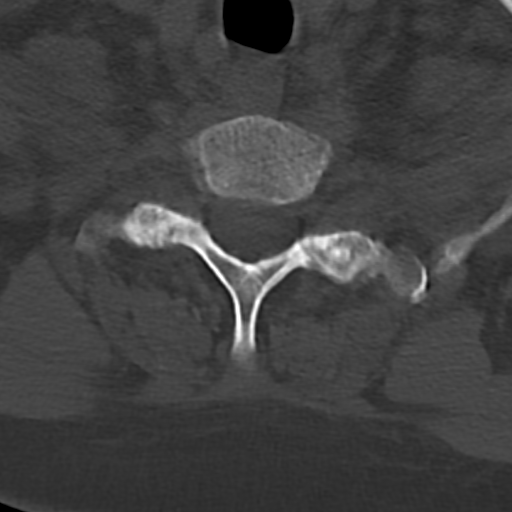
[im 52/103  bone]
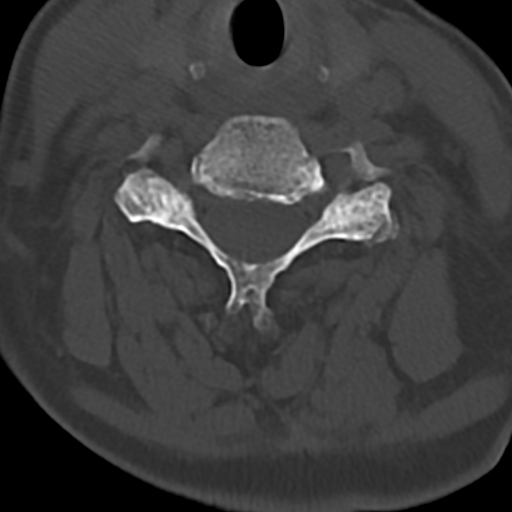
[im 69/103  bone]
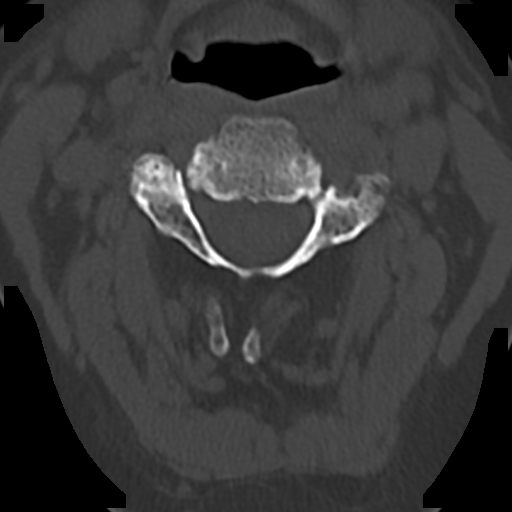
[im 86/103  soft-tissue]
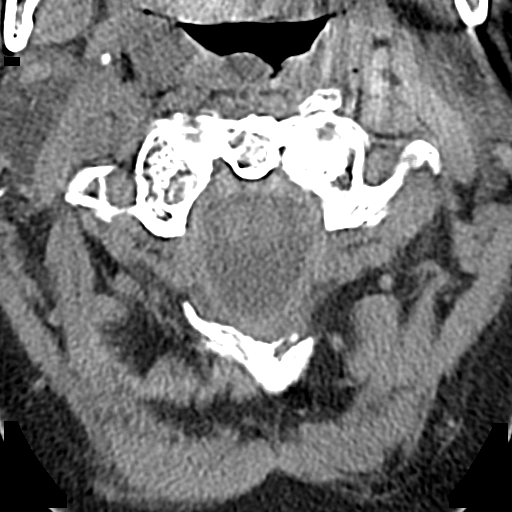
[im 86/103  bone]
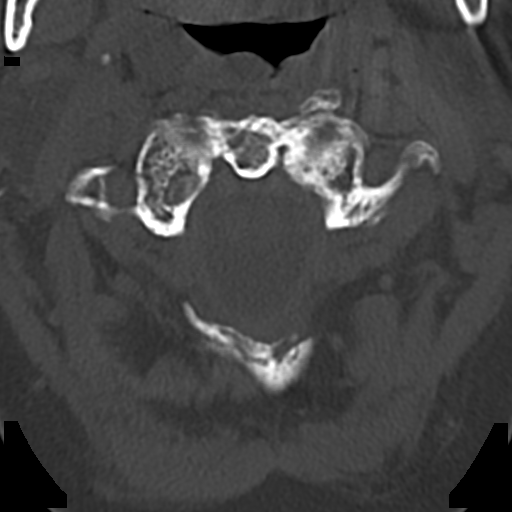

[15 of 33 positions shown; findings below may reference images not displayed]

FINDINGS: CT HEAD FINDINGS

Brain: No evidence of acute infarction, hemorrhage, hydrocephalus,
extra-axial collection or mass lesion/mass effect. Cerebral atrophy.

Vascular: Calcific intracranial atherosclerosis. No hyperdense
vessel identified.

Skull: No acute fracture.

Sinuses/Orbits: Clear visualized sinuses. No acute orbital findings.

Other: No mastoid effusions.

CT CERVICAL SPINE FINDINGS

Motion limited.

Alignment: No substantial sagittal subluxation.

Skull base and vertebrae: Vertebral body heights are maintained.

Soft tissues and spinal canal: No prevertebral fluid or swelling. No
visible canal hematoma.

Disc levels: Multilevel facet uncovertebral hypertrophy with varying
degrees of neuroforaminal stenosis. Mild-to-moderate multilevel
degenerative disc disease, most pronounced at C5-C6 posteriorly.

Upper chest: Visualized lung apices are clear.
IMPRESSION: 1. No evidence of acute intracranial abnormality.
2. No evidence of acute fracture or traumatic malalignment in the
cervical spine on motion limited study.

## 2023-11-06 DIAGNOSIS — G40909 Epilepsy, unspecified, not intractable, without status epilepticus: Secondary | ICD-10-CM | POA: Diagnosis not present

## 2023-11-06 DIAGNOSIS — R32 Unspecified urinary incontinence: Secondary | ICD-10-CM | POA: Diagnosis not present

## 2023-11-06 DIAGNOSIS — F0280 Dementia in other diseases classified elsewhere without behavioral disturbance: Secondary | ICD-10-CM | POA: Diagnosis not present

## 2023-11-06 DIAGNOSIS — R159 Full incontinence of feces: Secondary | ICD-10-CM | POA: Diagnosis not present

## 2023-11-06 DIAGNOSIS — Z741 Need for assistance with personal care: Secondary | ICD-10-CM | POA: Diagnosis not present

## 2023-11-06 DIAGNOSIS — G3101 Pick's disease: Secondary | ICD-10-CM | POA: Diagnosis not present

## 2023-11-06 DIAGNOSIS — Z87440 Personal history of urinary (tract) infections: Secondary | ICD-10-CM | POA: Diagnosis not present

## 2023-11-06 DIAGNOSIS — R1310 Dysphagia, unspecified: Secondary | ICD-10-CM | POA: Diagnosis not present

## 2023-11-13 DIAGNOSIS — F0280 Dementia in other diseases classified elsewhere without behavioral disturbance: Secondary | ICD-10-CM | POA: Diagnosis not present

## 2023-11-13 DIAGNOSIS — G40909 Epilepsy, unspecified, not intractable, without status epilepticus: Secondary | ICD-10-CM | POA: Diagnosis not present

## 2023-11-13 DIAGNOSIS — Z87440 Personal history of urinary (tract) infections: Secondary | ICD-10-CM | POA: Diagnosis not present

## 2023-11-13 DIAGNOSIS — G3101 Pick's disease: Secondary | ICD-10-CM | POA: Diagnosis not present

## 2023-11-13 DIAGNOSIS — R1310 Dysphagia, unspecified: Secondary | ICD-10-CM | POA: Diagnosis not present

## 2023-11-13 DIAGNOSIS — R32 Unspecified urinary incontinence: Secondary | ICD-10-CM | POA: Diagnosis not present

## 2023-11-20 DIAGNOSIS — Z87440 Personal history of urinary (tract) infections: Secondary | ICD-10-CM | POA: Diagnosis not present

## 2023-11-20 DIAGNOSIS — R32 Unspecified urinary incontinence: Secondary | ICD-10-CM | POA: Diagnosis not present

## 2023-11-20 DIAGNOSIS — R1310 Dysphagia, unspecified: Secondary | ICD-10-CM | POA: Diagnosis not present

## 2023-11-20 DIAGNOSIS — G3101 Pick's disease: Secondary | ICD-10-CM | POA: Diagnosis not present

## 2023-11-20 DIAGNOSIS — G40909 Epilepsy, unspecified, not intractable, without status epilepticus: Secondary | ICD-10-CM | POA: Diagnosis not present

## 2023-11-20 DIAGNOSIS — F0280 Dementia in other diseases classified elsewhere without behavioral disturbance: Secondary | ICD-10-CM | POA: Diagnosis not present

## 2023-11-22 DIAGNOSIS — G3101 Pick's disease: Secondary | ICD-10-CM | POA: Diagnosis not present

## 2023-11-22 DIAGNOSIS — F0280 Dementia in other diseases classified elsewhere without behavioral disturbance: Secondary | ICD-10-CM | POA: Diagnosis not present

## 2023-11-22 DIAGNOSIS — R32 Unspecified urinary incontinence: Secondary | ICD-10-CM | POA: Diagnosis not present

## 2023-11-22 DIAGNOSIS — R1310 Dysphagia, unspecified: Secondary | ICD-10-CM | POA: Diagnosis not present

## 2023-11-22 DIAGNOSIS — G40909 Epilepsy, unspecified, not intractable, without status epilepticus: Secondary | ICD-10-CM | POA: Diagnosis not present

## 2023-11-22 DIAGNOSIS — Z87440 Personal history of urinary (tract) infections: Secondary | ICD-10-CM | POA: Diagnosis not present

## 2023-11-27 DIAGNOSIS — G40909 Epilepsy, unspecified, not intractable, without status epilepticus: Secondary | ICD-10-CM | POA: Diagnosis not present

## 2023-11-27 DIAGNOSIS — Z87440 Personal history of urinary (tract) infections: Secondary | ICD-10-CM | POA: Diagnosis not present

## 2023-11-27 DIAGNOSIS — R32 Unspecified urinary incontinence: Secondary | ICD-10-CM | POA: Diagnosis not present

## 2023-11-27 DIAGNOSIS — G3101 Pick's disease: Secondary | ICD-10-CM | POA: Diagnosis not present

## 2023-11-27 DIAGNOSIS — R1310 Dysphagia, unspecified: Secondary | ICD-10-CM | POA: Diagnosis not present

## 2023-11-27 DIAGNOSIS — F0280 Dementia in other diseases classified elsewhere without behavioral disturbance: Secondary | ICD-10-CM | POA: Diagnosis not present

## 2023-12-04 DIAGNOSIS — R1310 Dysphagia, unspecified: Secondary | ICD-10-CM | POA: Diagnosis not present

## 2023-12-04 DIAGNOSIS — G40909 Epilepsy, unspecified, not intractable, without status epilepticus: Secondary | ICD-10-CM | POA: Diagnosis not present

## 2023-12-04 DIAGNOSIS — F0280 Dementia in other diseases classified elsewhere without behavioral disturbance: Secondary | ICD-10-CM | POA: Diagnosis not present

## 2023-12-04 DIAGNOSIS — G3101 Pick's disease: Secondary | ICD-10-CM | POA: Diagnosis not present

## 2023-12-04 DIAGNOSIS — R32 Unspecified urinary incontinence: Secondary | ICD-10-CM | POA: Diagnosis not present

## 2023-12-04 DIAGNOSIS — Z87440 Personal history of urinary (tract) infections: Secondary | ICD-10-CM | POA: Diagnosis not present

## 2023-12-07 DIAGNOSIS — R1310 Dysphagia, unspecified: Secondary | ICD-10-CM | POA: Diagnosis not present

## 2023-12-07 DIAGNOSIS — R159 Full incontinence of feces: Secondary | ICD-10-CM | POA: Diagnosis not present

## 2023-12-07 DIAGNOSIS — Z741 Need for assistance with personal care: Secondary | ICD-10-CM | POA: Diagnosis not present

## 2023-12-07 DIAGNOSIS — G3101 Pick's disease: Secondary | ICD-10-CM | POA: Diagnosis not present

## 2023-12-07 DIAGNOSIS — Z87440 Personal history of urinary (tract) infections: Secondary | ICD-10-CM | POA: Diagnosis not present

## 2023-12-07 DIAGNOSIS — G40909 Epilepsy, unspecified, not intractable, without status epilepticus: Secondary | ICD-10-CM | POA: Diagnosis not present

## 2023-12-07 DIAGNOSIS — F0280 Dementia in other diseases classified elsewhere without behavioral disturbance: Secondary | ICD-10-CM | POA: Diagnosis not present

## 2023-12-07 DIAGNOSIS — R32 Unspecified urinary incontinence: Secondary | ICD-10-CM | POA: Diagnosis not present

## 2023-12-11 DIAGNOSIS — R1310 Dysphagia, unspecified: Secondary | ICD-10-CM | POA: Diagnosis not present

## 2023-12-11 DIAGNOSIS — G40909 Epilepsy, unspecified, not intractable, without status epilepticus: Secondary | ICD-10-CM | POA: Diagnosis not present

## 2023-12-11 DIAGNOSIS — Z87440 Personal history of urinary (tract) infections: Secondary | ICD-10-CM | POA: Diagnosis not present

## 2023-12-11 DIAGNOSIS — G3101 Pick's disease: Secondary | ICD-10-CM | POA: Diagnosis not present

## 2023-12-11 DIAGNOSIS — F0280 Dementia in other diseases classified elsewhere without behavioral disturbance: Secondary | ICD-10-CM | POA: Diagnosis not present

## 2023-12-11 DIAGNOSIS — R32 Unspecified urinary incontinence: Secondary | ICD-10-CM | POA: Diagnosis not present

## 2023-12-18 DIAGNOSIS — R1310 Dysphagia, unspecified: Secondary | ICD-10-CM | POA: Diagnosis not present

## 2023-12-18 DIAGNOSIS — G3101 Pick's disease: Secondary | ICD-10-CM | POA: Diagnosis not present

## 2023-12-18 DIAGNOSIS — Z87440 Personal history of urinary (tract) infections: Secondary | ICD-10-CM | POA: Diagnosis not present

## 2023-12-18 DIAGNOSIS — R32 Unspecified urinary incontinence: Secondary | ICD-10-CM | POA: Diagnosis not present

## 2023-12-18 DIAGNOSIS — F0280 Dementia in other diseases classified elsewhere without behavioral disturbance: Secondary | ICD-10-CM | POA: Diagnosis not present

## 2023-12-18 DIAGNOSIS — G40909 Epilepsy, unspecified, not intractable, without status epilepticus: Secondary | ICD-10-CM | POA: Diagnosis not present

## 2023-12-25 DIAGNOSIS — F0280 Dementia in other diseases classified elsewhere without behavioral disturbance: Secondary | ICD-10-CM | POA: Diagnosis not present

## 2023-12-25 DIAGNOSIS — R1310 Dysphagia, unspecified: Secondary | ICD-10-CM | POA: Diagnosis not present

## 2023-12-25 DIAGNOSIS — G3101 Pick's disease: Secondary | ICD-10-CM | POA: Diagnosis not present

## 2023-12-25 DIAGNOSIS — R32 Unspecified urinary incontinence: Secondary | ICD-10-CM | POA: Diagnosis not present

## 2023-12-25 DIAGNOSIS — G40909 Epilepsy, unspecified, not intractable, without status epilepticus: Secondary | ICD-10-CM | POA: Diagnosis not present

## 2023-12-25 DIAGNOSIS — Z87440 Personal history of urinary (tract) infections: Secondary | ICD-10-CM | POA: Diagnosis not present

## 2024-01-01 DIAGNOSIS — Z87440 Personal history of urinary (tract) infections: Secondary | ICD-10-CM | POA: Diagnosis not present

## 2024-01-01 DIAGNOSIS — G3101 Pick's disease: Secondary | ICD-10-CM | POA: Diagnosis not present

## 2024-01-01 DIAGNOSIS — R1310 Dysphagia, unspecified: Secondary | ICD-10-CM | POA: Diagnosis not present

## 2024-01-01 DIAGNOSIS — R32 Unspecified urinary incontinence: Secondary | ICD-10-CM | POA: Diagnosis not present

## 2024-01-01 DIAGNOSIS — F0280 Dementia in other diseases classified elsewhere without behavioral disturbance: Secondary | ICD-10-CM | POA: Diagnosis not present

## 2024-01-01 DIAGNOSIS — G40909 Epilepsy, unspecified, not intractable, without status epilepticus: Secondary | ICD-10-CM | POA: Diagnosis not present

## 2024-01-07 DIAGNOSIS — R1310 Dysphagia, unspecified: Secondary | ICD-10-CM | POA: Diagnosis not present

## 2024-01-07 DIAGNOSIS — R159 Full incontinence of feces: Secondary | ICD-10-CM | POA: Diagnosis not present

## 2024-01-07 DIAGNOSIS — G3101 Pick's disease: Secondary | ICD-10-CM | POA: Diagnosis not present

## 2024-01-07 DIAGNOSIS — Z741 Need for assistance with personal care: Secondary | ICD-10-CM | POA: Diagnosis not present

## 2024-01-07 DIAGNOSIS — G40909 Epilepsy, unspecified, not intractable, without status epilepticus: Secondary | ICD-10-CM | POA: Diagnosis not present

## 2024-01-07 DIAGNOSIS — R32 Unspecified urinary incontinence: Secondary | ICD-10-CM | POA: Diagnosis not present

## 2024-01-07 DIAGNOSIS — Z87440 Personal history of urinary (tract) infections: Secondary | ICD-10-CM | POA: Diagnosis not present

## 2024-01-07 DIAGNOSIS — F0280 Dementia in other diseases classified elsewhere without behavioral disturbance: Secondary | ICD-10-CM | POA: Diagnosis not present

## 2024-01-15 DIAGNOSIS — R32 Unspecified urinary incontinence: Secondary | ICD-10-CM | POA: Diagnosis not present

## 2024-01-15 DIAGNOSIS — R1310 Dysphagia, unspecified: Secondary | ICD-10-CM | POA: Diagnosis not present

## 2024-01-15 DIAGNOSIS — G3101 Pick's disease: Secondary | ICD-10-CM | POA: Diagnosis not present

## 2024-01-15 DIAGNOSIS — G40909 Epilepsy, unspecified, not intractable, without status epilepticus: Secondary | ICD-10-CM | POA: Diagnosis not present

## 2024-01-15 DIAGNOSIS — F0280 Dementia in other diseases classified elsewhere without behavioral disturbance: Secondary | ICD-10-CM | POA: Diagnosis not present

## 2024-01-15 DIAGNOSIS — Z87440 Personal history of urinary (tract) infections: Secondary | ICD-10-CM | POA: Diagnosis not present

## 2024-01-22 DIAGNOSIS — R1310 Dysphagia, unspecified: Secondary | ICD-10-CM | POA: Diagnosis not present

## 2024-01-22 DIAGNOSIS — G40909 Epilepsy, unspecified, not intractable, without status epilepticus: Secondary | ICD-10-CM | POA: Diagnosis not present

## 2024-01-22 DIAGNOSIS — R32 Unspecified urinary incontinence: Secondary | ICD-10-CM | POA: Diagnosis not present

## 2024-01-22 DIAGNOSIS — F0280 Dementia in other diseases classified elsewhere without behavioral disturbance: Secondary | ICD-10-CM | POA: Diagnosis not present

## 2024-01-22 DIAGNOSIS — G3101 Pick's disease: Secondary | ICD-10-CM | POA: Diagnosis not present

## 2024-01-22 DIAGNOSIS — Z87440 Personal history of urinary (tract) infections: Secondary | ICD-10-CM | POA: Diagnosis not present

## 2024-01-29 DIAGNOSIS — R32 Unspecified urinary incontinence: Secondary | ICD-10-CM | POA: Diagnosis not present

## 2024-01-29 DIAGNOSIS — G40909 Epilepsy, unspecified, not intractable, without status epilepticus: Secondary | ICD-10-CM | POA: Diagnosis not present

## 2024-01-29 DIAGNOSIS — F0280 Dementia in other diseases classified elsewhere without behavioral disturbance: Secondary | ICD-10-CM | POA: Diagnosis not present

## 2024-01-29 DIAGNOSIS — R1310 Dysphagia, unspecified: Secondary | ICD-10-CM | POA: Diagnosis not present

## 2024-01-29 DIAGNOSIS — Z87440 Personal history of urinary (tract) infections: Secondary | ICD-10-CM | POA: Diagnosis not present

## 2024-01-29 DIAGNOSIS — G3101 Pick's disease: Secondary | ICD-10-CM | POA: Diagnosis not present

## 2024-02-05 DIAGNOSIS — R32 Unspecified urinary incontinence: Secondary | ICD-10-CM | POA: Diagnosis not present

## 2024-02-05 DIAGNOSIS — Z87440 Personal history of urinary (tract) infections: Secondary | ICD-10-CM | POA: Diagnosis not present

## 2024-02-05 DIAGNOSIS — R1310 Dysphagia, unspecified: Secondary | ICD-10-CM | POA: Diagnosis not present

## 2024-02-05 DIAGNOSIS — G40909 Epilepsy, unspecified, not intractable, without status epilepticus: Secondary | ICD-10-CM | POA: Diagnosis not present

## 2024-02-05 DIAGNOSIS — F0280 Dementia in other diseases classified elsewhere without behavioral disturbance: Secondary | ICD-10-CM | POA: Diagnosis not present

## 2024-02-05 DIAGNOSIS — G3101 Pick's disease: Secondary | ICD-10-CM | POA: Diagnosis not present

## 2024-02-06 DIAGNOSIS — R1310 Dysphagia, unspecified: Secondary | ICD-10-CM | POA: Diagnosis not present

## 2024-02-06 DIAGNOSIS — G40909 Epilepsy, unspecified, not intractable, without status epilepticus: Secondary | ICD-10-CM | POA: Diagnosis not present

## 2024-02-06 DIAGNOSIS — F0280 Dementia in other diseases classified elsewhere without behavioral disturbance: Secondary | ICD-10-CM | POA: Diagnosis not present

## 2024-02-06 DIAGNOSIS — R159 Full incontinence of feces: Secondary | ICD-10-CM | POA: Diagnosis not present

## 2024-02-06 DIAGNOSIS — R32 Unspecified urinary incontinence: Secondary | ICD-10-CM | POA: Diagnosis not present

## 2024-02-06 DIAGNOSIS — G3101 Pick's disease: Secondary | ICD-10-CM | POA: Diagnosis not present

## 2024-02-06 DIAGNOSIS — Z87440 Personal history of urinary (tract) infections: Secondary | ICD-10-CM | POA: Diagnosis not present

## 2024-02-06 DIAGNOSIS — Z741 Need for assistance with personal care: Secondary | ICD-10-CM | POA: Diagnosis not present

## 2024-02-12 DIAGNOSIS — R1310 Dysphagia, unspecified: Secondary | ICD-10-CM | POA: Diagnosis not present

## 2024-02-12 DIAGNOSIS — G40909 Epilepsy, unspecified, not intractable, without status epilepticus: Secondary | ICD-10-CM | POA: Diagnosis not present

## 2024-02-12 DIAGNOSIS — Z87440 Personal history of urinary (tract) infections: Secondary | ICD-10-CM | POA: Diagnosis not present

## 2024-02-12 DIAGNOSIS — G3101 Pick's disease: Secondary | ICD-10-CM | POA: Diagnosis not present

## 2024-02-12 DIAGNOSIS — F0280 Dementia in other diseases classified elsewhere without behavioral disturbance: Secondary | ICD-10-CM | POA: Diagnosis not present

## 2024-02-12 DIAGNOSIS — R32 Unspecified urinary incontinence: Secondary | ICD-10-CM | POA: Diagnosis not present

## 2024-02-19 DIAGNOSIS — G40909 Epilepsy, unspecified, not intractable, without status epilepticus: Secondary | ICD-10-CM | POA: Diagnosis not present

## 2024-02-19 DIAGNOSIS — R1310 Dysphagia, unspecified: Secondary | ICD-10-CM | POA: Diagnosis not present

## 2024-02-19 DIAGNOSIS — G3101 Pick's disease: Secondary | ICD-10-CM | POA: Diagnosis not present

## 2024-02-19 DIAGNOSIS — F0280 Dementia in other diseases classified elsewhere without behavioral disturbance: Secondary | ICD-10-CM | POA: Diagnosis not present

## 2024-02-19 DIAGNOSIS — Z87440 Personal history of urinary (tract) infections: Secondary | ICD-10-CM | POA: Diagnosis not present

## 2024-02-19 DIAGNOSIS — R32 Unspecified urinary incontinence: Secondary | ICD-10-CM | POA: Diagnosis not present

## 2024-02-26 DIAGNOSIS — R1310 Dysphagia, unspecified: Secondary | ICD-10-CM | POA: Diagnosis not present

## 2024-02-26 DIAGNOSIS — Z87440 Personal history of urinary (tract) infections: Secondary | ICD-10-CM | POA: Diagnosis not present

## 2024-02-26 DIAGNOSIS — R32 Unspecified urinary incontinence: Secondary | ICD-10-CM | POA: Diagnosis not present

## 2024-02-26 DIAGNOSIS — F0280 Dementia in other diseases classified elsewhere without behavioral disturbance: Secondary | ICD-10-CM | POA: Diagnosis not present

## 2024-02-26 DIAGNOSIS — G3101 Pick's disease: Secondary | ICD-10-CM | POA: Diagnosis not present

## 2024-02-26 DIAGNOSIS — G40909 Epilepsy, unspecified, not intractable, without status epilepticus: Secondary | ICD-10-CM | POA: Diagnosis not present

## 2024-02-27 DIAGNOSIS — Z87440 Personal history of urinary (tract) infections: Secondary | ICD-10-CM | POA: Diagnosis not present

## 2024-02-27 DIAGNOSIS — F0280 Dementia in other diseases classified elsewhere without behavioral disturbance: Secondary | ICD-10-CM | POA: Diagnosis not present

## 2024-02-27 DIAGNOSIS — G3101 Pick's disease: Secondary | ICD-10-CM | POA: Diagnosis not present

## 2024-02-27 DIAGNOSIS — R32 Unspecified urinary incontinence: Secondary | ICD-10-CM | POA: Diagnosis not present

## 2024-02-27 DIAGNOSIS — R1310 Dysphagia, unspecified: Secondary | ICD-10-CM | POA: Diagnosis not present

## 2024-02-27 DIAGNOSIS — G40909 Epilepsy, unspecified, not intractable, without status epilepticus: Secondary | ICD-10-CM | POA: Diagnosis not present

## 2024-02-28 DIAGNOSIS — G40909 Epilepsy, unspecified, not intractable, without status epilepticus: Secondary | ICD-10-CM | POA: Diagnosis not present

## 2024-02-28 DIAGNOSIS — F0280 Dementia in other diseases classified elsewhere without behavioral disturbance: Secondary | ICD-10-CM | POA: Diagnosis not present

## 2024-02-28 DIAGNOSIS — Z87440 Personal history of urinary (tract) infections: Secondary | ICD-10-CM | POA: Diagnosis not present

## 2024-02-28 DIAGNOSIS — R32 Unspecified urinary incontinence: Secondary | ICD-10-CM | POA: Diagnosis not present

## 2024-02-28 DIAGNOSIS — R1310 Dysphagia, unspecified: Secondary | ICD-10-CM | POA: Diagnosis not present

## 2024-02-28 DIAGNOSIS — G3101 Pick's disease: Secondary | ICD-10-CM | POA: Diagnosis not present

## 2024-03-04 DIAGNOSIS — Z87440 Personal history of urinary (tract) infections: Secondary | ICD-10-CM | POA: Diagnosis not present

## 2024-03-04 DIAGNOSIS — F0280 Dementia in other diseases classified elsewhere without behavioral disturbance: Secondary | ICD-10-CM | POA: Diagnosis not present

## 2024-03-04 DIAGNOSIS — R32 Unspecified urinary incontinence: Secondary | ICD-10-CM | POA: Diagnosis not present

## 2024-03-04 DIAGNOSIS — G40909 Epilepsy, unspecified, not intractable, without status epilepticus: Secondary | ICD-10-CM | POA: Diagnosis not present

## 2024-03-04 DIAGNOSIS — G3101 Pick's disease: Secondary | ICD-10-CM | POA: Diagnosis not present

## 2024-03-04 DIAGNOSIS — R1310 Dysphagia, unspecified: Secondary | ICD-10-CM | POA: Diagnosis not present

## 2024-03-08 DIAGNOSIS — R32 Unspecified urinary incontinence: Secondary | ICD-10-CM | POA: Diagnosis not present

## 2024-03-08 DIAGNOSIS — R159 Full incontinence of feces: Secondary | ICD-10-CM | POA: Diagnosis not present

## 2024-03-08 DIAGNOSIS — F0280 Dementia in other diseases classified elsewhere without behavioral disturbance: Secondary | ICD-10-CM | POA: Diagnosis not present

## 2024-03-08 DIAGNOSIS — G40909 Epilepsy, unspecified, not intractable, without status epilepticus: Secondary | ICD-10-CM | POA: Diagnosis not present

## 2024-03-08 DIAGNOSIS — Z741 Need for assistance with personal care: Secondary | ICD-10-CM | POA: Diagnosis not present

## 2024-03-08 DIAGNOSIS — G3101 Pick's disease: Secondary | ICD-10-CM | POA: Diagnosis not present

## 2024-03-08 DIAGNOSIS — R1310 Dysphagia, unspecified: Secondary | ICD-10-CM | POA: Diagnosis not present

## 2024-03-08 DIAGNOSIS — Z87440 Personal history of urinary (tract) infections: Secondary | ICD-10-CM | POA: Diagnosis not present

## 2024-03-11 DIAGNOSIS — Z87440 Personal history of urinary (tract) infections: Secondary | ICD-10-CM | POA: Diagnosis not present

## 2024-03-11 DIAGNOSIS — G3101 Pick's disease: Secondary | ICD-10-CM | POA: Diagnosis not present

## 2024-03-11 DIAGNOSIS — G40909 Epilepsy, unspecified, not intractable, without status epilepticus: Secondary | ICD-10-CM | POA: Diagnosis not present

## 2024-03-11 DIAGNOSIS — R32 Unspecified urinary incontinence: Secondary | ICD-10-CM | POA: Diagnosis not present

## 2024-03-11 DIAGNOSIS — F0280 Dementia in other diseases classified elsewhere without behavioral disturbance: Secondary | ICD-10-CM | POA: Diagnosis not present

## 2024-03-11 DIAGNOSIS — R1310 Dysphagia, unspecified: Secondary | ICD-10-CM | POA: Diagnosis not present

## 2024-03-16 DIAGNOSIS — G3101 Pick's disease: Secondary | ICD-10-CM | POA: Diagnosis not present

## 2024-03-16 DIAGNOSIS — Z87440 Personal history of urinary (tract) infections: Secondary | ICD-10-CM | POA: Diagnosis not present

## 2024-03-16 DIAGNOSIS — G40909 Epilepsy, unspecified, not intractable, without status epilepticus: Secondary | ICD-10-CM | POA: Diagnosis not present

## 2024-03-16 DIAGNOSIS — R1310 Dysphagia, unspecified: Secondary | ICD-10-CM | POA: Diagnosis not present

## 2024-03-16 DIAGNOSIS — F0280 Dementia in other diseases classified elsewhere without behavioral disturbance: Secondary | ICD-10-CM | POA: Diagnosis not present

## 2024-03-16 DIAGNOSIS — R32 Unspecified urinary incontinence: Secondary | ICD-10-CM | POA: Diagnosis not present

## 2024-03-18 DIAGNOSIS — F0280 Dementia in other diseases classified elsewhere without behavioral disturbance: Secondary | ICD-10-CM | POA: Diagnosis not present

## 2024-03-18 DIAGNOSIS — Z87440 Personal history of urinary (tract) infections: Secondary | ICD-10-CM | POA: Diagnosis not present

## 2024-03-18 DIAGNOSIS — R32 Unspecified urinary incontinence: Secondary | ICD-10-CM | POA: Diagnosis not present

## 2024-03-18 DIAGNOSIS — R1310 Dysphagia, unspecified: Secondary | ICD-10-CM | POA: Diagnosis not present

## 2024-03-18 DIAGNOSIS — G40909 Epilepsy, unspecified, not intractable, without status epilepticus: Secondary | ICD-10-CM | POA: Diagnosis not present

## 2024-03-18 DIAGNOSIS — G3101 Pick's disease: Secondary | ICD-10-CM | POA: Diagnosis not present

## 2024-03-25 DIAGNOSIS — F0280 Dementia in other diseases classified elsewhere without behavioral disturbance: Secondary | ICD-10-CM | POA: Diagnosis not present

## 2024-03-25 DIAGNOSIS — R1310 Dysphagia, unspecified: Secondary | ICD-10-CM | POA: Diagnosis not present

## 2024-03-25 DIAGNOSIS — Z87440 Personal history of urinary (tract) infections: Secondary | ICD-10-CM | POA: Diagnosis not present

## 2024-03-25 DIAGNOSIS — G3101 Pick's disease: Secondary | ICD-10-CM | POA: Diagnosis not present

## 2024-03-25 DIAGNOSIS — G40909 Epilepsy, unspecified, not intractable, without status epilepticus: Secondary | ICD-10-CM | POA: Diagnosis not present

## 2024-03-25 DIAGNOSIS — R32 Unspecified urinary incontinence: Secondary | ICD-10-CM | POA: Diagnosis not present

## 2024-04-01 DIAGNOSIS — R32 Unspecified urinary incontinence: Secondary | ICD-10-CM | POA: Diagnosis not present

## 2024-04-01 DIAGNOSIS — F0280 Dementia in other diseases classified elsewhere without behavioral disturbance: Secondary | ICD-10-CM | POA: Diagnosis not present

## 2024-04-01 DIAGNOSIS — G3101 Pick's disease: Secondary | ICD-10-CM | POA: Diagnosis not present

## 2024-04-01 DIAGNOSIS — Z87440 Personal history of urinary (tract) infections: Secondary | ICD-10-CM | POA: Diagnosis not present

## 2024-04-01 DIAGNOSIS — R1310 Dysphagia, unspecified: Secondary | ICD-10-CM | POA: Diagnosis not present

## 2024-04-01 DIAGNOSIS — G40909 Epilepsy, unspecified, not intractable, without status epilepticus: Secondary | ICD-10-CM | POA: Diagnosis not present
